# Patient Record
Sex: Male | Born: 1940 | Race: White | Hispanic: No | Marital: Married | Smoking: Never smoker
Health system: Southern US, Community
[De-identification: ages and names within clinical notes are randomized; demographics above are authoritative.]

## PROBLEM LIST (undated history)

## (undated) DIAGNOSIS — C61 Malignant neoplasm of prostate: Secondary | ICD-10-CM

## (undated) DIAGNOSIS — I872 Venous insufficiency (chronic) (peripheral): Secondary | ICD-10-CM

## (undated) DIAGNOSIS — G20A1 Parkinson's disease without dyskinesia, without mention of fluctuations: Secondary | ICD-10-CM

## (undated) DIAGNOSIS — G20 Parkinson's disease: Secondary | ICD-10-CM

## (undated) HISTORY — PX: TONSILLECTOMY: SUR1361

## (undated) HISTORY — PX: HERNIA REPAIR: SHX51

## (undated) HISTORY — PX: PROSTATE SURGERY: SHX751

---

## 2011-11-09 DIAGNOSIS — G20A1 Parkinson's disease without dyskinesia, without mention of fluctuations: Secondary | ICD-10-CM | POA: Insufficient documentation

## 2015-01-27 DIAGNOSIS — I2510 Atherosclerotic heart disease of native coronary artery without angina pectoris: Secondary | ICD-10-CM | POA: Insufficient documentation

## 2015-10-14 DIAGNOSIS — M40204 Unspecified kyphosis, thoracic region: Secondary | ICD-10-CM | POA: Insufficient documentation

## 2017-04-11 DIAGNOSIS — M5090 Cervical disc disorder, unspecified, unspecified cervical region: Secondary | ICD-10-CM | POA: Insufficient documentation

## 2017-04-11 DIAGNOSIS — E785 Hyperlipidemia, unspecified: Secondary | ICD-10-CM | POA: Insufficient documentation

## 2017-04-11 DIAGNOSIS — I471 Supraventricular tachycardia, unspecified: Secondary | ICD-10-CM | POA: Insufficient documentation

## 2017-06-23 DIAGNOSIS — H31002 Unspecified chorioretinal scars, left eye: Secondary | ICD-10-CM | POA: Insufficient documentation

## 2017-06-23 DIAGNOSIS — H524 Presbyopia: Secondary | ICD-10-CM | POA: Insufficient documentation

## 2017-06-23 DIAGNOSIS — H02831 Dermatochalasis of right upper eyelid: Secondary | ICD-10-CM | POA: Insufficient documentation

## 2017-06-23 DIAGNOSIS — H43813 Vitreous degeneration, bilateral: Secondary | ICD-10-CM | POA: Insufficient documentation

## 2017-06-23 DIAGNOSIS — H25042 Posterior subcapsular polar age-related cataract, left eye: Secondary | ICD-10-CM | POA: Insufficient documentation

## 2017-06-23 DIAGNOSIS — H2513 Age-related nuclear cataract, bilateral: Secondary | ICD-10-CM | POA: Insufficient documentation

## 2017-06-23 DIAGNOSIS — H35411 Lattice degeneration of retina, right eye: Secondary | ICD-10-CM | POA: Insufficient documentation

## 2017-10-26 DIAGNOSIS — E7800 Pure hypercholesterolemia, unspecified: Secondary | ICD-10-CM | POA: Insufficient documentation

## 2018-10-03 DIAGNOSIS — Z8719 Personal history of other diseases of the digestive system: Secondary | ICD-10-CM | POA: Insufficient documentation

## 2018-10-22 DIAGNOSIS — R1909 Other intra-abdominal and pelvic swelling, mass and lump: Secondary | ICD-10-CM | POA: Insufficient documentation

## 2018-11-05 DIAGNOSIS — M79672 Pain in left foot: Secondary | ICD-10-CM | POA: Insufficient documentation

## 2019-01-23 DIAGNOSIS — M545 Low back pain, unspecified: Secondary | ICD-10-CM | POA: Insufficient documentation

## 2019-09-10 ENCOUNTER — Other Ambulatory Visit: Payer: Self-pay

## 2019-09-10 ENCOUNTER — Ambulatory Visit: Payer: Medicare PPO | Admitting: Podiatry

## 2019-09-10 DIAGNOSIS — L600 Ingrowing nail: Secondary | ICD-10-CM | POA: Diagnosis not present

## 2019-09-10 DIAGNOSIS — M79676 Pain in unspecified toe(s): Secondary | ICD-10-CM

## 2019-09-10 DIAGNOSIS — I739 Peripheral vascular disease, unspecified: Secondary | ICD-10-CM

## 2019-09-10 NOTE — Progress Notes (Signed)
  Subjective:  Patient ID: Joseph Nelson, male    DOB: September 12, 1940,  MRN: [id number]  Chief Complaint  Patient presents with  . Nail Problem    Lt hallux medial border x 2 wks; 8/10 occasional pain -wrose with shoes -pt dneis swelling/ drianage -w/ redness  -feet very red in AM txc: none    79 y.o. male presents with the above complaint. History confirmed with patient.   Objective:  Physical Exam: warm, good capillary refill, no trophic changes or ulcerative lesions, normal DP and PT pulses delayed capillary refill bilat, and normal sensory exam.  Painful ingrowing nail at  medial border of the left, hallux; without warmth, erythema or drainage  Assessment:   1. Ingrown nail   2. Pain around toenail   3. PAD (peripheral artery disease) ([place])      Plan:  Patient was evaluated and treated and all questions answered.  Ingrown Nail, left -Nail gently debrided in slant back fashion  PAD -Educated on microcirculatory PAD. He has good pedal pulses. Discussed that there are few treatments for microcirculatory disease but we could do circulatory tests if these worsen.  Return if symptoms worsen or fail to improve.   MDM

## 2019-10-28 ENCOUNTER — Ambulatory Visit: Payer: Medicare PPO | Admitting: Podiatry

## 2019-10-29 ENCOUNTER — Ambulatory Visit (INDEPENDENT_AMBULATORY_CARE_PROVIDER_SITE_OTHER): Payer: Medicare PPO

## 2019-10-29 ENCOUNTER — Telehealth: Payer: Self-pay | Admitting: *Deleted

## 2019-10-29 ENCOUNTER — Ambulatory Visit: Payer: Medicare PPO | Admitting: Podiatry

## 2019-10-29 ENCOUNTER — Other Ambulatory Visit: Payer: Self-pay

## 2019-10-29 DIAGNOSIS — I739 Peripheral vascular disease, unspecified: Secondary | ICD-10-CM | POA: Diagnosis not present

## 2019-10-29 DIAGNOSIS — L600 Ingrowing nail: Secondary | ICD-10-CM | POA: Diagnosis not present

## 2019-10-29 DIAGNOSIS — M79676 Pain in unspecified toe(s): Secondary | ICD-10-CM

## 2019-10-29 NOTE — Telephone Encounter (Signed)
[place] scheduled [id number] for 11/06/2019 arrive 1:30pm for 2:00pm testing. Faxed orders to [place].

## 2019-10-29 NOTE — Telephone Encounter (Signed)
-----   Message from Evelina Bucy, DPM sent at 10/29/2019 10:03 AM EDT ----- Can we order non-invasive arterials at [facility]?

## 2019-10-29 NOTE — Telephone Encounter (Signed)
Message left informing pt of [facility] appt 11/06/2019.

## 2019-10-29 NOTE — Progress Notes (Signed)
  Subjective:  Patient ID: Joseph Nelson, male    DOB: 1941-03-24,  MRN: [id number]  Chief Complaint  Patient presents with  . Foot Problem    redness, swelling and pian of BL toes (1-10) (Lt>Rt) x 1 year; 8/10 occasional pain -pt staqtes it only happens when resting or at night time - pt staqtes,": it goes away when I'm walking." -w/ stiffness and numbness at Lt hallux Tx: compression sock     79 y.o. male presents with the above complaint. History confirmed with patient.   Objective:  Physical Exam: warm, good capillary refill, no trophic changes or ulcerative lesions, normal DP and PT pulses delayed capillary refill bilat, and normal sensory exam.  Painful ingrowing nail at  medial border of the left, hallux; without warmth, erythema or drainage  Assessment:   1. PAD (peripheral artery disease) ([place])   2. Ingrown nail   3. Pain around toenail    Plan:  Patient was evaluated and treated and all questions answered.  Ingrown Nail, left -Again debrided in slant back fashion to patient relief  PAD -Possible microcirc given good pedal pulses. Order non-invasive vascular studies for further eval  Return in about 1 month (around 11/28/2019) for Review vascular studies.

## 2019-11-12 ENCOUNTER — Encounter: Payer: Self-pay | Admitting: Podiatry

## 2019-11-25 ENCOUNTER — Other Ambulatory Visit: Payer: Self-pay

## 2019-11-25 ENCOUNTER — Ambulatory Visit: Payer: Medicare PPO | Admitting: Podiatry

## 2019-11-25 DIAGNOSIS — L600 Ingrowing nail: Secondary | ICD-10-CM

## 2019-11-25 DIAGNOSIS — I739 Peripheral vascular disease, unspecified: Secondary | ICD-10-CM

## 2019-11-25 NOTE — Progress Notes (Signed)
°  Subjective:  Patient ID: Joseph Nelson, male    DOB: 03/02/1941,  MRN: [id number]  Chief Complaint  Patient presents with   Foot Pain    F/U PAD and BL foot pain Pt. states," it's the same, worse on the Rt than the LT; 3/10 pain." -worse at night -no swelling Tx: none    Results    Review vascular results   79 y.o. male presents with the above complaint. History confirmed with patient.   Objective:  Physical Exam: warm, good capillary refill, no trophic changes or ulcerative lesions, normal DP and PT pulses delayed capillary refill bilat, and normal sensory exam.  Painful ingrowing nail at  medial border of the left, hallux; without warmth, erythema or drainage  Assessment:   1. PAD (peripheral artery disease) ([place])   2. Ingrown nail    Plan:  Patient was evaluated and treated and all questions answered.  Ingrown Nail, left -Nail debrided again in slant back fashion  PAD -Reviewed vascular studies discussed microcirculatory etiology. Discussed lifestyle modifications. Offered referral to vascular surgery patient declined.  No follow-ups on file.

## 2019-12-03 ENCOUNTER — Ambulatory Visit: Payer: Medicare PPO | Admitting: Podiatry

## 2020-02-13 DIAGNOSIS — I739 Peripheral vascular disease, unspecified: Secondary | ICD-10-CM | POA: Insufficient documentation

## 2020-05-21 ENCOUNTER — Ambulatory Visit: Payer: Medicare PPO | Admitting: Podiatry

## 2020-05-25 ENCOUNTER — Ambulatory Visit (INDEPENDENT_AMBULATORY_CARE_PROVIDER_SITE_OTHER): Payer: Medicare PPO | Admitting: Podiatry

## 2020-05-25 DIAGNOSIS — Z5329 Procedure and treatment not carried out because of patient's decision for other reasons: Secondary | ICD-10-CM

## 2020-05-25 NOTE — Progress Notes (Signed)
No show for appt. 

## 2020-05-28 ENCOUNTER — Ambulatory Visit: Payer: Medicare PPO | Admitting: Podiatry

## 2020-05-28 ENCOUNTER — Other Ambulatory Visit: Payer: Self-pay

## 2020-05-28 ENCOUNTER — Encounter: Payer: Self-pay | Admitting: Podiatry

## 2020-05-28 DIAGNOSIS — L600 Ingrowing nail: Secondary | ICD-10-CM

## 2020-05-28 DIAGNOSIS — M79676 Pain in unspecified toe(s): Secondary | ICD-10-CM

## 2020-05-28 DIAGNOSIS — I739 Peripheral vascular disease, unspecified: Secondary | ICD-10-CM

## 2020-05-28 NOTE — Progress Notes (Signed)
  Subjective:  Patient ID: Joseph Nelson, male    DOB: Sep 02, 1940,  MRN: [id number]  Chief Complaint  Patient presents with  . Nail Problem    I have some soreness in the left bit toe and would like to get the toenails trimmed   80 y.o. male presents with the above complaint. History confirmed with patient.   Objective:  Physical Exam: warm, good capillary refill, no trophic changes or ulcerative lesions, normal DP and PT pulses delayed capillary refill bilat, and normal sensory exam.  Painful ingrowing nail at  medial border of the left, hallux; without warmth, erythema or drainage. Nails elongated and thickened.  Assessment:   1. Ingrown nail   2. PAD (peripheral artery disease) ([facility])   3. Pain around toenail    Plan:  Patient was evaluated and treated and all questions answered.  Ingrown Nail, left -Again debrided in slant back fashion. Other nails trimmed, courtesy.  PAD -Again discussed microcirculatory disease and explained at length. Advise he continue walking to promote demand for collateral flow. In asbsence of major blockage unlikely that vascular intervention can improve microcirculatory insufficiency.  No follow-ups on file.

## 2020-09-11 ENCOUNTER — Emergency Department (HOSPITAL_BASED_OUTPATIENT_CLINIC_OR_DEPARTMENT_OTHER): Payer: Medicare PPO

## 2020-09-11 ENCOUNTER — Other Ambulatory Visit: Payer: Self-pay

## 2020-09-11 ENCOUNTER — Emergency Department (HOSPITAL_BASED_OUTPATIENT_CLINIC_OR_DEPARTMENT_OTHER)
Admission: EM | Admit: 2020-09-11 | Discharge: 2020-09-11 | Disposition: A | Discharge status: Home / Self Care | Payer: Medicare PPO | Attending: Emergency Medicine | Admitting: Emergency Medicine

## 2020-09-11 DIAGNOSIS — G20 Parkinson's disease: Secondary | ICD-10-CM | POA: Diagnosis not present

## 2020-09-11 DIAGNOSIS — M79604 Pain in right leg: Secondary | ICD-10-CM | POA: Diagnosis not present

## 2020-09-11 DIAGNOSIS — M79605 Pain in left leg: Secondary | ICD-10-CM | POA: Diagnosis not present

## 2020-09-11 DIAGNOSIS — Z79899 Other long term (current) drug therapy: Secondary | ICD-10-CM | POA: Diagnosis not present

## 2020-09-11 DIAGNOSIS — I2510 Atherosclerotic heart disease of native coronary artery without angina pectoris: Secondary | ICD-10-CM | POA: Diagnosis not present

## 2020-09-11 DIAGNOSIS — M79606 Pain in leg, unspecified: Secondary | ICD-10-CM

## 2020-09-11 NOTE — ED Notes (Signed)
Pt has call bell, lights turned down and ED process explained to pt and spouse

## 2020-09-11 NOTE — Discharge Instructions (Signed)
See your Physician for recheck.  °

## 2020-09-11 NOTE — ED Provider Notes (Signed)
[facility] EMERGENCY DEPARTMENT Provider Note   CSN: [id number] Arrival date & time: 09/11/20  1303     History Chief Complaint  Patient presents with  . Leg Pain    left    Joseph Nelson is a 80 y.o. male.  Pt reports he had a cramp in the back of his left leg today.  Pt reports he had a similiar episode last month.  Pt is concerned that he could have a blood clot in his legs.  Pt reports his feet look discolored to him,  Pt reports he had a blood clot years ago.  Pt is not on any blood thinners   The history is provided by the patient. No language interpreter was used.  Leg Pain Location:  Leg Leg location:  L leg and R leg Pain details:    Quality:  Aching   Radiates to:  Does not radiate   Severity:  No pain   Timing:  Constant Chronicity:  New Foreign body present:  No foreign bodies Relieved by:  Nothing Worsened by:  Nothing      No past medical history on file.  Patient Active Problem List   Diagnosis Date Noted  . PVD (peripheral vascular disease) ([place]) 02/13/2020  . Low back pain 01/23/2019  . Pain in both feet 11/05/2018  . Groin swelling 10/22/2018  . S/P inguinal hernia repair 10/03/2018  . Pure hypercholesterolemia 10/26/2017  . 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 Dermatochalasis of both upper eyelids 06/23/2017  . Lattice degeneration, right 06/23/2017  . Nuclear sclerotic cataract of both eyes 06/23/2017  . Posterior subcapsular age-related cataract, left eye 06/23/2017  . Posterior vitreous detachment of both eyes 06/23/2017  . Presbyopia of both eyes 06/23/2017  . Cervical disc disorder 04/11/2017  . HLD (hyperlipidemia) 04/11/2017  . Supraventricular tachycardia ([place]) 04/11/2017  . Kyphosis of thoracic region 10/14/2015  . Atherosclerosis of native coronary artery of native heart without angina pectoris 01/27/2015  . Cervical post-laminectomy syndrome 04/29/2013  . Routine history and physical examination of adult  06/07/2012  . Parkinson's disease ([place]) 11/09/2011  . Recurrent depressive disorder, current episode mild ([place]) 04/06/2011  . Anxiety state 02/02/2011    No past surgical history on file.     No family history on file.     Home Medications Prior to Admission medications   Medication Sig Start Date End Date Taking? Authorizing Provider  ALPRAZolam Duanne Moron) 0.25 MG tablet Take 1 tablet by mouth daily as needed. 10/17/18   [provider]  carbidopa-levodopa-entacapone (STALEVO) 25-100-200 MG tablet TAKE 1 TABLET BY MOUTH 5 TIMES A DAY 07/08/14   [provider]  celecoxib (CELEBREX) 100 MG capsule  09/07/19   [provider]  Cholecalciferol 125 MCG (5000 UT) capsule TAKE 5000 UNITS BY MOUTH DAILY. 09/30/19   [provider]  DULoxetine HCl 40 MG CPEP  10/16/19   [provider]  ezetimibe (ZETIA) 10 MG tablet Take by mouth. 10/11/19 10/10/20  [provider]  gabapentin (NEURONTIN) 100 MG capsule  10/03/19   [provider]  nitroGLYCERIN (NITROSTAT) 0.4 MG SL tablet PLACE 1 TABLET UNDER THE TONGUE EVERY 5 MINUTES AS NEEDED FOR CHEST PAIN. 09/23/19   [provider]    Allergies    Clonazepam, Penicillins, Rabeprazole, Ciprofloxacin, Clarithromycin, Codeine, Cyclobenzaprine, Diphenhydramine hcl, Ciprofloxacin hcl, Citalopram, Erythromycin, Meperidine, Minocycline, Nabumetone, Other, Prednisone, Propoxyphene, Sulfa antibiotics, and Tramadol  Review of Systems   Review of Systems  All other systems  reviewed and are negative.   Physical Exam Updated Vital Signs BP 113/65   Pulse 65   Temp 97.9 F (36.6 C) (Oral)   Resp 18   Ht 6' (1.829 m)   Wt 83 kg   SpO2 97%   BMI 24.82 kg/m   Physical Exam Vitals and nursing note reviewed.  Constitutional:      Appearance: He is well-developed.  HENT:     Head: Normocephalic and atraumatic.  Eyes:     Conjunctiva/sclera: Conjunctivae normal.  Cardiovascular:     Rate  and Rhythm: Normal rate and regular rhythm.     Heart sounds: No murmur heard.   Pulmonary:     Effort: Pulmonary effort is normal. No respiratory distress.     Breath sounds: Normal breath sounds.  Abdominal:     Palpations: Abdomen is soft.     Tenderness: There is no abdominal tenderness.  Musculoskeletal:        General: No swelling or tenderness. Normal range of motion.     Cervical back: Neck supple.     Comments: Good pulses, normal cap refill   Skin:    General: Skin is warm and dry.  Neurological:     General: No focal deficit present.     Mental Status: He is alert.  Psychiatric:        Mood and Affect: Mood normal.     ED Results / Procedures / Treatments   Labs (all labs ordered are listed, but only abnormal results are displayed) Labs Reviewed - No data to display  EKG EKG Interpretation  Date/Time:  Friday September 11 2020 13:24:02 EDT Ventricular Rate:  72 PR Interval:  156 QRS Duration: 99 QT Interval:  393 QTC Calculation: 431 R Axis:   -5 Text Interpretation: Sinus rhythm Confirmed by Lennice Sites ([id number]) on 09/11/2020 1:29:02 PM   Radiology US Venous Img Lower Bilateral (DVT)  Result Date: 09/11/2020 CLINICAL DATA:  Lower extremity pain bilaterally EXAM: BILATERAL LOWER EXTREMITY VENOUS DUPLEX ULTRASOUND TECHNIQUE: Gray-scale sonography with graded compression, as well as color Doppler and duplex ultrasound were performed to evaluate the lower extremity deep venous systems from the level of the common femoral vein and including the common femoral, femoral, profunda femoral, popliteal and calf veins including the posterior tibial, peroneal and gastrocnemius veins when visible. The superficial great saphenous vein was also interrogated. Spectral Doppler was utilized to evaluate flow at rest and with distal augmentation maneuvers in the common femoral, femoral and popliteal veins. COMPARISON:  None. FINDINGS: RIGHT LOWER EXTREMITY Common Femoral Vein: No  evidence of thrombus. Normal compressibility, respiratory phasicity and response to augmentation. Saphenofemoral Junction: No evidence of thrombus. Normal compressibility and flow on color Doppler imaging. Profunda Femoral Vein: No evidence of thrombus. Normal compressibility and flow on color Doppler imaging. Femoral Vein: No evidence of thrombus. Normal compressibility, respiratory phasicity and response to augmentation. Popliteal Vein: No evidence of thrombus. Normal compressibility, respiratory phasicity and response to augmentation. Calf Veins: No evidence of thrombus. Normal compressibility and flow on color Doppler imaging. Superficial Great Saphenous Vein: No evidence of thrombus. Normal compressibility. Venous Reflux:  None. Other Findings:  None. LEFT LOWER EXTREMITY Common Femoral Vein: No evidence of thrombus. Normal compressibility, respiratory phasicity and response to augmentation. Saphenofemoral Junction: No evidence of thrombus. Normal compressibility and flow on color Doppler imaging. Profunda Femoral Vein: No evidence of thrombus. Normal compressibility and flow on color Doppler imaging. Femoral Vein: No evidence of thrombus. Normal compressibility, respiratory phasicity and response  to augmentation. Popliteal Vein: No evidence of thrombus. Normal compressibility, respiratory phasicity and response to augmentation. Calf Veins: No evidence of thrombus. Normal compressibility and flow on color Doppler imaging. Superficial Great Saphenous Vein: No evidence of thrombus. Normal compressibility. Venous Reflux:  None. Other Findings:  None. IMPRESSION: No evidence of deep venous thrombosis in either lower extremity. Electronically Signed   By: Lowella Grip III M.D.   On: 09/11/2020 15:08    Procedures Procedures   Medications Ordered in ED Medications - No data to display  ED Course  I have reviewed the triage vital signs and the nursing notes.  Pertinent labs & imaging results that were  available during my care of the patient were reviewed by me and considered in my medical decision making (see chart for details).    MDM Rules/Calculators/A&P                          MDM:  Doppler ultrasound is normal  Pt counseled on reulsts  Final Clinical Impression(s) / ED Diagnoses Final diagnoses:  Right leg pain  Left leg pain    Rx / DC Orders ED Discharge Orders    None    An After Visit Summary was printed and given to the patient.    Fransico Meadow, PA-C 09/11/20 [place], [place], DO 09/11/20 1703

## 2020-12-30 ENCOUNTER — Other Ambulatory Visit: Payer: Self-pay

## 2020-12-30 ENCOUNTER — Emergency Department (HOSPITAL_BASED_OUTPATIENT_CLINIC_OR_DEPARTMENT_OTHER): Payer: Medicare PPO

## 2020-12-30 ENCOUNTER — Emergency Department (HOSPITAL_BASED_OUTPATIENT_CLINIC_OR_DEPARTMENT_OTHER)
Admission: EM | Admit: 2020-12-30 | Discharge: 2020-12-30 | Disposition: A | Discharge status: Home / Self Care | Payer: Medicare PPO | Attending: Emergency Medicine | Admitting: Emergency Medicine

## 2020-12-30 ENCOUNTER — Encounter (HOSPITAL_BASED_OUTPATIENT_CLINIC_OR_DEPARTMENT_OTHER): Payer: Self-pay

## 2020-12-30 DIAGNOSIS — Z79899 Other long term (current) drug therapy: Secondary | ICD-10-CM | POA: Diagnosis not present

## 2020-12-30 DIAGNOSIS — Z8546 Personal history of malignant neoplasm of prostate: Secondary | ICD-10-CM | POA: Diagnosis not present

## 2020-12-30 DIAGNOSIS — M549 Dorsalgia, unspecified: Secondary | ICD-10-CM | POA: Insufficient documentation

## 2020-12-30 DIAGNOSIS — R109 Unspecified abdominal pain: Secondary | ICD-10-CM | POA: Diagnosis not present

## 2020-12-30 DIAGNOSIS — G20 Parkinson's disease: Secondary | ICD-10-CM | POA: Diagnosis not present

## 2020-12-30 HISTORY — DX: Parkinson's disease without dyskinesia, without mention of fluctuations: G20.A1

## 2020-12-30 HISTORY — DX: Parkinson's disease: G20

## 2020-12-30 HISTORY — DX: Malignant neoplasm of prostate: C61

## 2020-12-30 HISTORY — DX: Venous insufficiency (chronic) (peripheral): I87.2

## 2020-12-30 LAB — BASIC METABOLIC PANEL
Anion gap: 8 (ref 5–15)
BUN: 18 mg/dL (ref 8–23)
CO2: 26 mmol/L (ref 22–32)
Calcium: 8.9 mg/dL (ref 8.9–10.3)
Chloride: 102 mmol/L (ref 98–111)
Creatinine, Ser: 0.93 mg/dL (ref 0.61–1.24)
GFR, Estimated: >60 mL/min (ref >60)
Glucose, Bld: 115 mg/dL — ABNORMAL HIGH (ref 70–99)
Potassium: 4 mmol/L (ref 3.5–5.1)
Sodium: 136 mmol/L (ref 135–145)

## 2020-12-30 LAB — CBC
HCT: 46.7 % (ref 39.0–52.0)
Hemoglobin: 15.7 g/dL (ref 13.0–17.0)
MCH: 30.9 pg (ref 26.0–34.0)
MCHC: 33.6 g/dL (ref 30.0–36.0)
MCV: 91.9 fL (ref 80.0–100.0)
Platelets: 227 10*3/uL (ref 150–400)
RBC: 5.08 MIL/uL (ref 4.22–5.81)
RDW: 12.9 % (ref 11.5–15.5)
WBC: 7.8 10*3/uL (ref 4.0–10.5)
nRBC: 0 % (ref 0.0–0.2)

## 2020-12-30 LAB — URINALYSIS, ROUTINE W REFLEX MICROSCOPIC
Bilirubin Urine: NEGATIVE
Glucose, UA: 100 mg/dL — AB
Hgb urine dipstick: NEGATIVE
Ketones, ur: 15 mg/dL — AB
Leukocytes,Ua: NEGATIVE
Nitrite: NEGATIVE
Protein, ur: NEGATIVE mg/dL
Specific Gravity, Urine: 1.025 (ref 1.005–1.030)
pH: 5 (ref 5.0–8.0)

## 2020-12-30 MED ORDER — METHOCARBAMOL 500 MG PO TABS: 500.0000 mg | ORAL_TABLET | Freq: Three times a day (TID) | ORAL | 0 refills | Status: AC | PRN

## 2020-12-30 MED ORDER — IOHEXOL 300 MG/ML  SOLN
100.0000 mL | Freq: Once | INTRAMUSCULAR | Status: AC | PRN
  Administered 2020-12-30: 100 mL via INTRAVENOUS

## 2020-12-30 NOTE — Discharge Instructions (Addendum)
Dear Joseph Nelson,  Today we evaluated you in the emergency department for left lower back pain.   We ruled out a urinary track infection  with a urinalysis which was negative.   We also checked a basic metabolic panel which showed no renal cause for your back pain. Due to your history of prostate disease and your tenderness to palpation over the left iliac crest, we ordered a CT of your abdomen and pelvis as well as your lumbar spine. That imaging showed no concerning acute changes. We did notice the fatty lesion in your bone at that point. We feel that your back pain may be due to some irritation resulting from this lesion since the pain you are reporting is located directly at this point. For this we will give you a low dose of Robaxin to help with the muscle spasm/pain.  Please follow up with your primary care physician in 1 week.

## 2020-12-30 NOTE — ED Notes (Signed)
Asked to evaluate PT in Waiting Room- per registration PT not feeling well. Vitals are on Flowsheet. PT does not appear to be in distress at this time and is A/O x 4. PT states that he has Parkinson's medications that are due. After speaking to Charge RN, PT was allowed to take due meds (provided PT with water). PT was able to successfully take meds and PT stated he had a light lunch and felt a little light headed (PT received 1 pack crackers).

## 2020-12-30 NOTE — ED Triage Notes (Addendum)
Pt c/o left flank pain x 3 days-when asked if urinary sx states "I did have a harder time getting started today"-NAD-steady gait with own cane

## 2020-12-30 NOTE — ED Provider Notes (Signed)
[facility] EMERGENCY DEPARTMENT Provider Note   CSN: [id number] Arrival date & time: 12/30/20  1327     History Chief Complaint  Patient presents with   Flank Pain    Joseph Nelson is a 80 y.o. male. presents with 2-3 months of dull aching pain that has worsened to moderate-severe burning pain over the past three days.  He reports that the pain worsens with physical activity/movement and alleviated with rest. Does occasionally feel pain while resting and reports it is "opportunistic". He has tried tylenol which has also helped some but does not completely relieve it. He has ben evaluated previously for same pain and was prescribed lidocaine patches which did not improve pain at all.  The history is provided by the patient. No language interpreter was used.  Flank Pain This is a new problem. The current episode started more than 1 week ago. The problem occurs hourly. The problem has been rapidly worsening. Pertinent negatives include no chest pain, no abdominal pain, no headaches and no shortness of breath. The symptoms are aggravated by walking, bending, twisting and standing. The symptoms are relieved by NSAIDs and rest. He has tried acetaminophen and rest for the symptoms. The treatment provided moderate relief.      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   Nuclear sclerotic cataract of both eyes 06/23/2017   Posterior subcapsular age-related cataract, left eye 06/23/2017   Posterior  vitreous detachment of both eyes 06/23/2017   Presbyopia of both eyes 06/23/2017   Cervical disc disorder 04/11/2017   HLD (hyperlipidemia) 04/11/2017   Supraventricular tachycardia ([place]) 04/11/2017   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  Social History   Tobacco Use   Smoking status: Never   Smokeless tobacco: Never  Substance Use Topics   Alcohol use: Never   Drug use: Never    Home Medications Prior to Admission medications   Medication Sig Start Date End Date Taking? Authorizing Provider  methocarbamol (ROBAXIN) 500 MG tablet Take 1 tablet (500 mg total) by mouth 3 (three) times daily as needed for up to 7 days for muscle spasms. 12/30/20 01/06/21 Yes Delene Ruffini, MD  ALPRAZolam Duanne Moron) 0.25 MG tablet Take 1 tablet by mouth daily as needed. 10/17/18   [provider]  carbidopa-levodopa-entacapone (STALEVO) 25-100-200 MG tablet TAKE 1 TABLET BY MOUTH 5 TIMES A DAY 07/08/14   [provider]  celecoxib (CELEBREX) 100 MG capsule  09/07/19   [provider]  Cholecalciferol  125 MCG (5000 UT) capsule TAKE 5000 UNITS BY MOUTH DAILY. 09/30/19   [provider]  DULoxetine HCl 40 MG CPEP  10/16/19   [provider]  ezetimibe (ZETIA) 10 MG tablet Take by mouth. 10/11/19 10/10/20  [provider]  gabapentin (NEURONTIN) 100 MG capsule  10/03/19   [provider]  nitroGLYCERIN (NITROSTAT) 0.4 MG SL tablet PLACE 1 TABLET UNDER THE TONGUE  EVERY 5 MINUTES AS NEEDED FOR CHEST PAIN. 09/23/19   [provider]    Allergies    Clonazepam, Penicillins, Rabeprazole, Ciprofloxacin, Clarithromycin, Codeine, Cyclobenzaprine, Diphenhydramine hcl, Ciprofloxacin hcl, Citalopram, Erythromycin, Meperidine, Minocycline, Nabumetone, Other, Prednisone, Propoxyphene, Sulfa antibiotics, and Tramadol  Review of Systems   Review of Systems  Constitutional:  Negative for fatigue and fever.  Respiratory:  Negative for cough, chest tightness and shortness of breath.   Cardiovascular:  Negative for chest pain, palpitations and leg swelling.  Gastrointestinal:  Positive for constipation. Negative for abdominal pain, blood in stool, diarrhea, nausea and vomiting.  Genitourinary:  Positive for flank pain.  Musculoskeletal:  Positive for back pain.  Skin:  Negative for rash and wound.  Neurological:  Negative for numbness and headaches.  Psychiatric/Behavioral: Negative.     Physical Exam Updated Vital Signs BP 118/73 (BP Location: Left Arm)   Pulse 60   Temp 98.4 F (36.9 C) (Oral)   Resp 18   Ht 6' (1.829 m)   Wt 84.4 kg   SpO2 100%   BMI 25.23 kg/m   Physical Exam Constitutional:      Appearance: Normal appearance. He is normal weight.  HENT:     Head: Normocephalic and atraumatic.  Cardiovascular:     Rate and Rhythm: Normal rate and regular rhythm.  Pulmonary:     Effort: Pulmonary effort is normal.     Breath sounds: Normal breath sounds.  Abdominal:     General: Abdomen is flat. Bowel sounds are normal.     Palpations: Abdomen is soft.  Musculoskeletal:        General: Tenderness present.  Skin:    General: Skin is warm and dry.  Neurological:     General: No focal deficit present.     Mental Status: He is alert.  Psychiatric:        Mood and Affect: Mood normal.        Behavior: Behavior normal.        Thought Content: Thought content normal.        Judgment: Judgment normal.    ED Results / Procedures /  Treatments   Labs (all labs ordered are listed, but only abnormal results are displayed) Labs Reviewed  URINALYSIS, ROUTINE W REFLEX MICROSCOPIC - Abnormal; Notable for the following components:      Result Value   Color, Urine ORANGE (*)    APPearance HAZY (*)    Glucose, UA 100 (*)    Ketones, ur 15 (*)    All other components within normal limits  BASIC METABOLIC PANEL - Abnormal; Notable for the following components:   Glucose, Bld 115 (*)    All other components within normal limits  CBC    EKG None  Radiology CT ABDOMEN PELVIS W CONTRAST  Result Date: 12/30/2020 CLINICAL DATA:  Pt c/o left flank pain x 3 days-when asked if urinary sx states EXAM: CT ABDOMEN AND PELVIS WITH CONTRAST TECHNIQUE: Multidetector CT imaging of the abdomen and pelvis was performed using the standard protocol following bolus administration of intravenous contrast. CONTRAST:  146m OMNIPAQUE IOHEXOL 300 MG/ML  SOLN COMPARISON:  None. FINDINGS: Lower chest: 5 mm left lower lobe pulmonary nodule (4:22). 3 mm right lower lobe pulmonary nodule (4:2). Coronary calcification. Hepatobiliary: No focal liver abnormality. No gallstones, gallbladder wall thickening, or pericholecystic fluid. No biliary dilatation. Pancreas: No focal lesion. Normal pancreatic contour. No surrounding inflammatory changes. No main pancreatic ductal dilatation. Spleen: Normal in size without focal abnormality. Adrenals/Urinary Tract: Left adrenal gland calcifications.  No adrenal nodule bilaterally. Bilateral kidneys enhance symmetrically. There is a 5.8 cm fluid density lesion within the left kidney that likely represents a simple renal cyst. No hydronephrosis. No hydroureter. The urinary bladder is unremarkable. On delayed imaging, there is no urothelial wall thickening and there are no filling defects in the opacified portions of the bilateral collecting systems or ureters. Stomach/Bowel: Stomach is within normal limits. No evidence of bowel  wall thickening or dilatation. Diffuse sigmoid diverticulosis. Appendix appears normal. Vascular/Lymphatic: No abdominal aorta or iliac aneurysm. Severe atherosclerotic plaque of the aorta and its branches. No abdominal, pelvic, or inguinal lymphadenopathy. Reproductive: Status post prostatectomy. Other: No intraperitoneal free fluid. No intraperitoneal free gas. No organized fluid collection. Nonspecific adjacent 0.6 cm and 0.2 cm soft tissue densities along the posterior right hepatic lobe (2:66, 5:23). Musculoskeletal: No abdominal wall hernia or abnormality. There is a lytic lesion of the iliac bone with associated central calcification (2:45). Lesion appears to have a narrow zone of transition and is well-defined. Otherwise no suspicious lytic or blastic osseous lesions. No acute displaced fracture. Levoscoliosis of the lumbar spine with compensatory dextroscoliosis of the thoracolumbar spine. Multilevel severe degenerative changes of the spine. Please see separately dictated CT lumbar spine 12/30/2020. IMPRESSION: 1. Diffuse sigmoid diverticulosis with no acute diverticulitis. 2. 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 3. Nonspecific 0.6 cm and 0.2 cm soft tissue densities along the posterior right hepatic lobe. Comparison with prior cross-sectional imaging may be of value. 4. A 5 mm left lower and a 3 mm right lower pulmonary nodule. No follow-up needed if patient is low-risk (and has no known or suspected primary neoplasm). Non-contrast chest CT can be considered in 12 months if patient is high-risk. This recommendation follows the consensus statement: Guidelines for Management of Incidental Pulmonary Nodules Detected on CT Images: From the Fleischner Society 2017; Radiology 2017; 284:228-243. 5. Aortic Atherosclerosis (ICD10-I70.0). 6. Please see separately dictated CT lumbar spine 12/30/2020. These results were called by telephone  at the time of interpretation on 12/30/2020 at 5:46 pm [facility] who verbally acknowledged these results. Electronically Signed   By: MIven FinnM.D.   On: 12/30/2020 17:57   CT L-SPINE NO CHARGE  Result Date: 12/30/2020 CLINICAL DATA:  Left flank pain over the last 3 days. EXAM: CT LUMBAR SPINE WITHOUT CONTRAST TECHNIQUE: Multidetector CT imaging of the lumbar spine was performed without intravenous contrast administration. Multiplanar CT image reconstructions were also generated. COMPARISON:  None. FINDINGS: Segmentation: 5 lumbar type vertebral bodies. Alignment: Thoracolumbar curvature convex to the right and lower lumbar curvature convex to the left. Vertebrae: No fracture or focal lesion. Discogenic sclerotic change of the endplates on the left at T12-L1 and on the right at L4-5. Paraspinal and other soft tissues: See results of abdominal CT. Disc levels: T9-10, T10-11 and T11-12: No significant finding. T12-L1: Disc degeneration more pronounced on the left. Endplate osteophytes and bulging of the disc. Mild facet hypertrophy. Mild left lateral recess stenosis.  L1-2: Disc degeneration more pronounced on the left with vacuum phenomenon. Mild facet hypertrophy. Mild narrowing of the left lateral recess and foramen on the left. L2-3: Disc degeneration with vacuum phenomenon. Bilateral facet degeneration. Narrowing of both lateral recesses and neural foramina. L3-4: Endplate osteophytes and mild bulging of the disc. Bilateral facet degeneration. Mild right lateral recess and foraminal stenosis. L4-5: Disc degeneration more pronounced on the right. Endplate osteophytes and bulging of the disc. Facet and ligamentous hypertrophy right more than left. Multifactorial stenosis at this level that could cause neural compression on either side, more likely the right. Severe right foraminal stenosis. Foramen on the left widely patent. L5-S1: Endplate osteophytes and bulging of the disc. Facet  and ligamentous hypertrophy. Narrowing of the subarticular lateral recesses and neural foramina that could cause neural compression, particularly on the right. IMPRESSION: Scoliosis and multilevel degenerative changes outlined above. In this patient with left flank pain, there is potential for left-sided neural compression in the region of right convex curvature at T12-L1, L1-2 and L2-3. Multifactorial stenosis at L4-5 with considerable potential for neural compression, particularly on the right. Bilateral lateral recess and foraminal stenosis at L5-S1, right more than left. Electronically Signed   By: Nelson Chimes M.D.   On: 12/30/2020 17:48    Procedures Procedures   Medications Ordered in ED Medications  iohexol (OMNIPAQUE) 300 MG/ML solution 100 mL (100 mLs Intravenous Contrast Given 12/30/20 1652)    ED Course  I have reviewed the triage vital signs and the nursing notes.  Pertinent labs & imaging results that were available during my care of the patient were reviewed by me and considered in my medical decision making (see chart for details).    MDM Rules/Calculators/A&P                           Patient presents with 2-3 months of dull aching pain that has worsened to moderate-severe burning pain over the past three days.  He reports that the pain worsens with physical activity/movement and alleviated with rest. Does occasionally feel pain while resting and reports it is "opportunistic". He has tried tylenol which has also helped some but does not completely relieve it. He has ben evaluated previously for same pain and was prescribed lidocaine patches which did not improve pain at all.   UA negative for nitrites. No burning with urination. No CVA tenderness. CBC negative.  and pyelo unlikely.  He has a history of radical prostatectomy, Weak stream, but denies acute changes in urination. BMP not showing any increase in BUN or Cr. Urinary retention with AKI seems unlikely. He has had  shingles in the past, reports this pain is different. No obvious rashes seen on exam.  He is tender to palpation over his left lower back, with tenderness to palpation to the left paraspinal muscles most prominent over iliac crest. Denies history of fall/trauma.  Given patient's history of prostatectomy. Ct abd and pelvis and CT L- spine to r/o stone vs fracture vs mets to bone. 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 No acute abnormalities noted and felt safe for discharge home. He will needs to f/u with primary care physician.  Final Clinical Impression(s) / ED Diagnoses Final diagnoses:  Back pain    Rx / DC Orders ED Discharge Orders          Ordered    methocarbamol (ROBAXIN) 500 MG tablet  3 times daily  PRN        12/30/20 1912             Delene Ruffini, MD 12/31/20 [place]    Tegeler, Gwenyth Allegra, MD 01/01/21 [phone]

## 2021-01-07 DIAGNOSIS — M48061 Spinal stenosis, lumbar region without neurogenic claudication: Secondary | ICD-10-CM | POA: Insufficient documentation

## 2021-01-07 DIAGNOSIS — R400 Somnolence: Secondary | ICD-10-CM | POA: Insufficient documentation

## 2021-08-03 ENCOUNTER — Ambulatory Visit: Payer: Medicare PPO | Admitting: Sports Medicine

## 2021-08-03 ENCOUNTER — Other Ambulatory Visit: Payer: Self-pay

## 2021-08-03 DIAGNOSIS — M79674 Pain in right toe(s): Secondary | ICD-10-CM | POA: Diagnosis not present

## 2021-08-03 DIAGNOSIS — B351 Tinea unguium: Secondary | ICD-10-CM | POA: Diagnosis not present

## 2021-08-03 DIAGNOSIS — L84 Corns and callosities: Secondary | ICD-10-CM

## 2021-08-03 DIAGNOSIS — I739 Peripheral vascular disease, unspecified: Secondary | ICD-10-CM

## 2021-08-03 DIAGNOSIS — M79675 Pain in left toe(s): Secondary | ICD-10-CM | POA: Diagnosis not present

## 2021-08-03 NOTE — Progress Notes (Signed)
Subjective: ?Joseph Nelson is a 81 y.o. male patient seen today in office with complaint of mildly painful thickened and elongated toenails; unable to trim. Patient also reports that he gets some calluses at the big toe joints that sometimes gets very sore when he is walking and standing.  Denies history of Diabetes or neuropathy but has a significant history of PVD and Parkinson's.  Patient has no other pedal complaints at this time.  ? ?Patient is assisted by wife this visit. ? ?Patient Active Problem List  ? Diagnosis Date Noted  ? PVD (peripheral vascular disease) ([place]) 02/13/2020  ? Low back pain 01/23/2019  ? Pain in both feet 11/05/2018  ? Groin swelling 10/22/2018  ? S/P inguinal hernia repair 10/03/2018  ? Pure hypercholesterolemia 10/26/2017  ? 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 Dermatochalasis of both upper eyelids 06/23/2017  ? Lattice degeneration, right 06/23/2017  ? Nuclear sclerotic cataract of both eyes 06/23/2017  ? Posterior subcapsular age-related cataract, left eye 06/23/2017  ? Posterior vitreous detachment of both eyes 06/23/2017  ? Presbyopia of both eyes 06/23/2017  ? Cervical disc disorder 04/11/2017  ? HLD (hyperlipidemia) 04/11/2017  ? Supraventricular tachycardia ([place]) 04/11/2017  ? Kyphosis of thoracic region 10/14/2015  ? Atherosclerosis of native coronary artery of native heart without angina pectoris 01/27/2015  ? Cervical post-laminectomy syndrome 04/29/2013  ? Routine history and physical examination of adult 06/07/2012  ? Parkinson's disease ([place]) 11/09/2011  ? 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 Anxiety state 02/02/2011  ? ? ?Current Outpatient Medications on File Prior to Visit  ?Medication Sig Dispense Refill  ? celecoxib (CELEBREX) 200 MG capsule Take by mouth.    ? DULoxetine HCl 40 MG CPEP Take 1 tablet by mouth daily.    ? hydrOXYzine (ATARAX) 10 MG tablet Take by mouth.    ? lidocaine (LIDODERM) 5 % Apply 1 patch to 3 painful  areas if needed, 12/24 hours per day.    ? rosuvastatin (CRESTOR) 10 MG tablet Take by mouth.    ? tiZANidine (ZANAFLEX) 2 MG tablet Take 1-2 tabs twice daily as needed for pain, insomnia.    ? ALPRAZolam (XANAX) 0.25 MG tablet Take 1 tablet by mouth daily as needed.    ? Carbidopa-Levodopa ER (SINEMET CR) 25-100 MG tablet controlled release Take by mouth.    ? carbidopa-levodopa-entacapone (STALEVO) 25-100-200 MG tablet TAKE 1 TABLET BY MOUTH 5 TIMES A DAY    ? celecoxib (CELEBREX) 100 MG capsule     ? Cholecalciferol 125 MCG (5000 UT) capsule TAKE 5000 UNITS BY MOUTH DAILY.    ? diclofenac (FLECTOR) 1.3 % PTCH SMARTSIG:Topical    ? DULoxetine HCl 40 MG CPEP     ? ezetimibe (ZETIA) 10 MG tablet Take by mouth.    ? gabapentin (NEURONTIN) 100 MG capsule     ? ketoconazole (NIZORAL) 2 % shampoo Apply topically.    ? nitroGLYCERIN (NITROSTAT) 0.4 MG SL tablet PLACE 1 TABLET UNDER THE TONGUE EVERY 5 MINUTES AS NEEDED FOR CHEST PAIN.    ? nystatin cream (MYCOSTATIN) Apply topically.    ? triamcinolone cream (KENALOG) 0.1 % Apply topically.    ? ?No current facility-administered medications on file prior to visit.  ? ? ?Allergies  ?Allergen Reactions  ? Clonazepam Other (See Comments)  ?  Excessive drowsy  ?Excessive drowsiness. ?Excessive drowsy  ?  ? Penicillins Anaphylaxis and Swelling  ? Rabeprazole Anaphylaxis and Swelling  ?  Throat swelling ?  ?  Ciprofloxacin Nausea Only  ? Clarithromycin Other (See Comments)  ?  Unknown.  ? Codeine Other (See Comments)  ?  Jerking.   ? Cyclobenzaprine Other (See Comments)  ?  Flushing of the skin ?Hot flash ?Hot flashes.  ?Flushing of the skin ?  ? Diphenhydramine Hcl Other (See Comments)  ?  "heart went crazy" ?"heart went crazy". ?"heart went crazy" ?  ? Ciprofloxacin Hcl Rash  ?  Cannot tolerate orally. OK with eye drops. ?  ? Citalopram Anxiety, Nausea Only and Other (See Comments)  ?  Makes nervous ?  ? Erythromycin Other (See Comments) and Rash  ?  Unknown rxn ?  ?  Meperidine Rash  ? Minocycline Other (See Comments) and Nausea Only  ?  SIDE EFFECT- NAUSEA  ?SIDE EFFECT- NAUSEA  ?  ? Nabumetone Anxiety and Other (See Comments)  ?  "my brain felt really weird and agitated" ?"my brain felt really weird and agitated" ?  ? Other Rash  ? Prednisone Other (See Comments) and Rash  ?  Hot flash ?Hot flashes  ?  ? Propoxyphene Rash  ? Sulfa Antibiotics Rash and Other (See Comments)  ? Tramadol Rash  ? ? ?Objective: ?Physical Exam ? ?General: Well developed, nourished, no acute distress, awake, alert and oriented x 3 ? ?Vascular: Dorsalis pedis artery 1/4 bilateral, Posterior tibial artery 0/4 bilateral likely due to edema at ankles, no signs of ischemia or rest pain noted, skin temperature warm to warm proximal to distal bilateral lower extremities, mild varicosities, minimal pedal hair present bilateral. ? ?Neurological: Gross sensation present via light touch bilateral.  ? ?Dermatological: Skin is warm, dry, and supple bilateral, Nails 1-10 are tender, long, thick, and discolored with mild subungal debris, no webspace macerations present bilateral, no open lesions present bilateral, + callus/hyperkeratotic tissue present bilateral first MPJs. No signs of infection bilateral. ? ?Musculoskeletal: Prominent first metatarsal heads bilateral and shuffling gait secondary to Parkinson's. ? ?Assessment and Plan:  ?Problem List Items Addressed This Visit   ? ?  ? Cardiovascular and Mediastinum  ? PVD (peripheral vascular disease) ([place])  ? Relevant Medications  ? rosuvastatin (CRESTOR) 10 MG tablet  ? ?Other Visit Diagnoses   ? ? Pain due to onychomycosis of toenails of both feet    -  Primary  ? Relevant Medications  ? ketoconazole (NIZORAL) 2 % shampoo  ? nystatin cream (MYCOSTATIN)  ? Callus      ? ?  ?  ? ? ?-Examined patient.  ?-Discussed treatment options for painful mycotic nails and callus. ?-Mechanically debrided and reduced mycotic nails with sterile nail nipper and dremel nail file  without incident. ?-Mechanically debrided callus x2 at medial first MPJs bilaterally using a sterile 15 blade without incident ?-Encouraged elevation of legs to assist with any edema control and provided patient gentle compression using Surgigrip compression sleeves advised patient that if this works well may benefit long-term from additional support from compression garments ?-Recommend good supportive shoes daily for foot type and dispense foot miracle cream for dry skin ?-Patient to return in 3 months for follow up nail and callus care with Dr. Blenda Mounts or sooner if symptoms worsen. ? ?Landis Martins, DPM ? ?

## 2021-10-12 DIAGNOSIS — M159 Polyosteoarthritis, unspecified: Secondary | ICD-10-CM | POA: Insufficient documentation

## 2021-11-02 IMAGING — US US EXTREM LOW VENOUS
1 series · 13 of 24 positions shown · non-contrast
Comparison: None.

CLINICAL DATA: Lower extremity pain bilaterally

EXAM:
BILATERAL LOWER EXTREMITY VENOUS DUPLEX ULTRASOUND
TECHNIQUE: Gray-scale sonography with graded compression, as well as color
Doppler and duplex ultrasound were performed to evaluate the lower
extremity deep venous systems from the level of the common femoral
vein and including the common femoral, femoral, profunda femoral,
popliteal and calf veins including the posterior tibial, peroneal
and gastrocnemius veins when visible. The superficial great
saphenous vein was also interrogated. Spectral Doppler was utilized
to evaluate flow at rest and with distal augmentation maneuvers in
the common femoral, femoral and popliteal veins.

[Series 1: us extrem low venous · 13 of 68 slices shown]
[im 1/68]
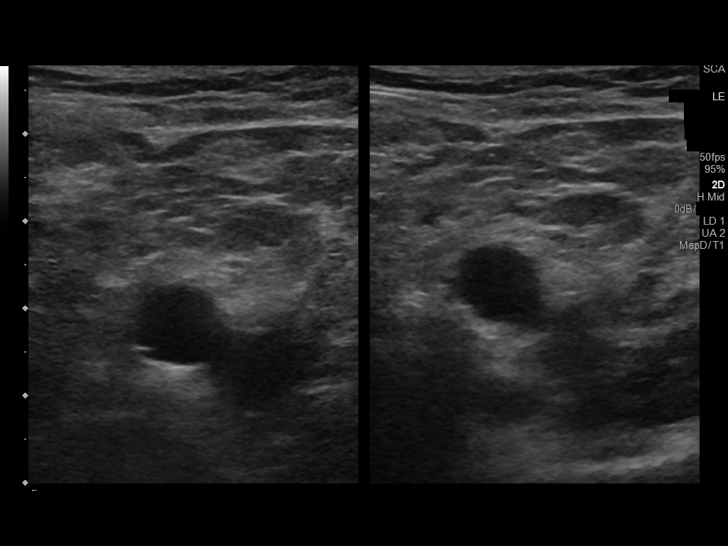
[im 6/68]
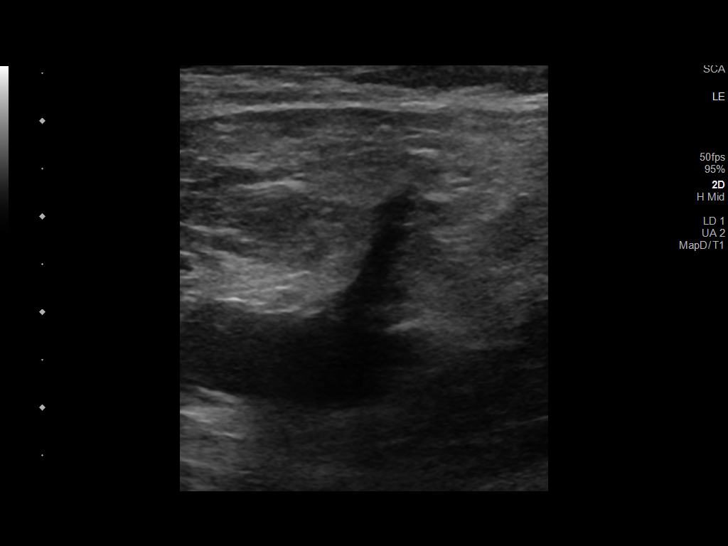
[im 12/68]
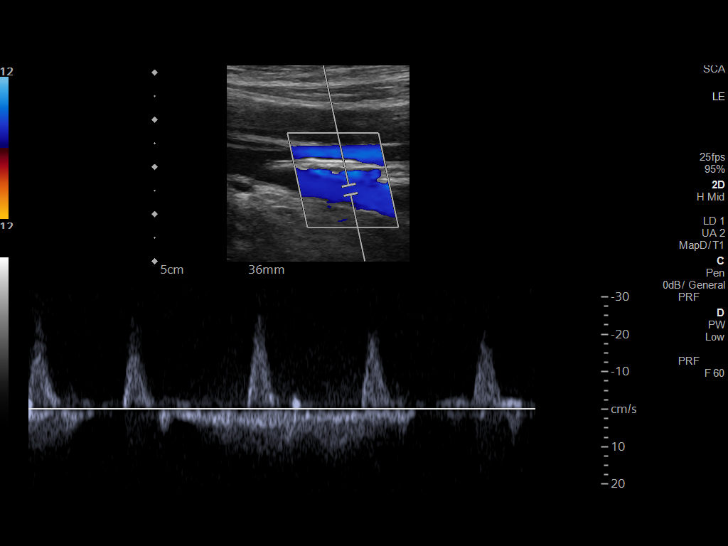
[im 18/68]
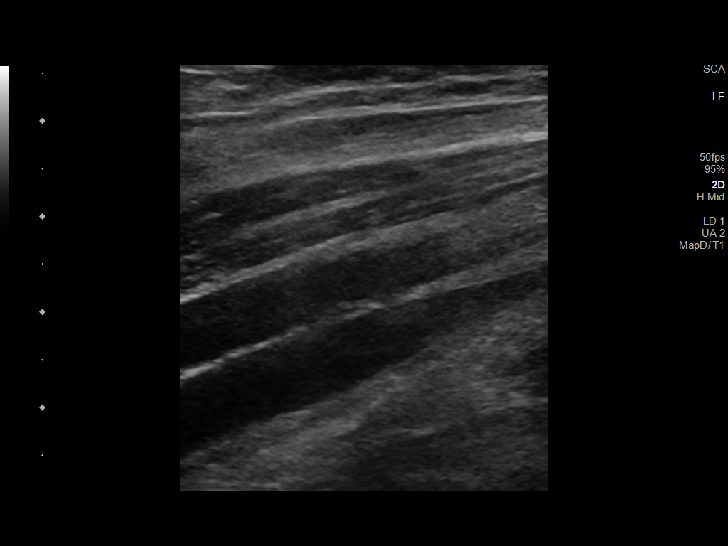
[im 24/68]
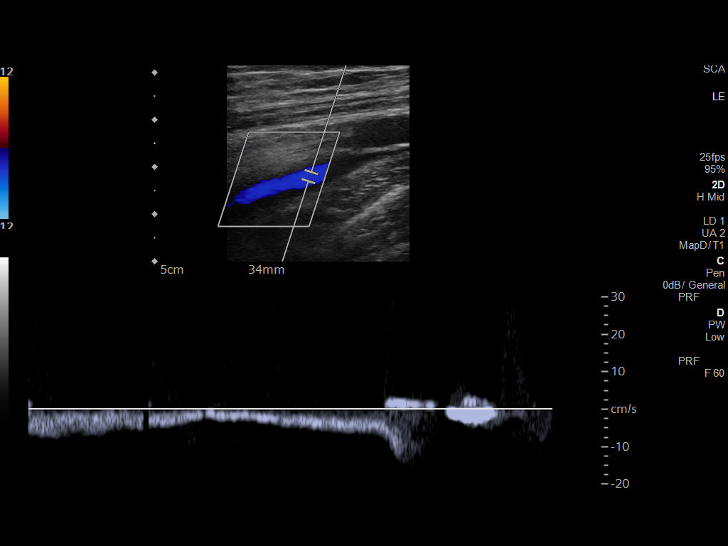
[im 30/68]
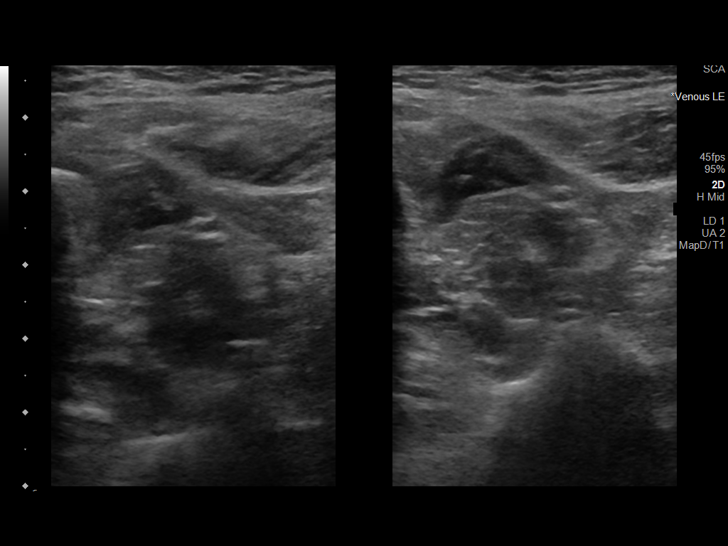
[im 35/68]
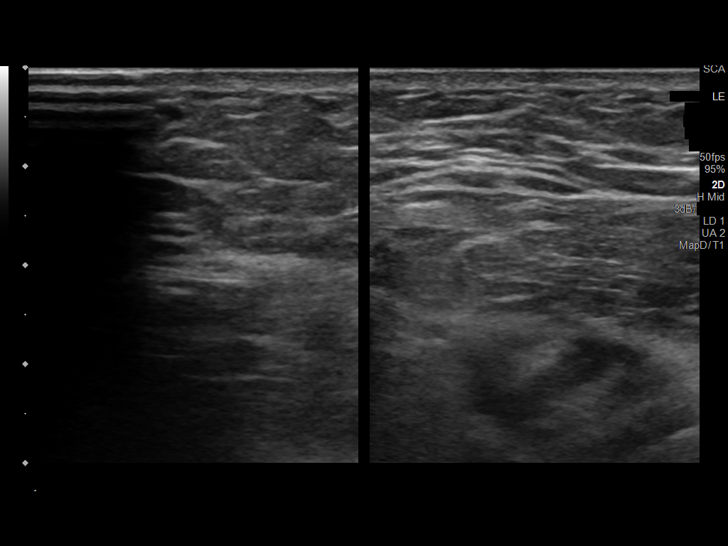
[im 38/68]
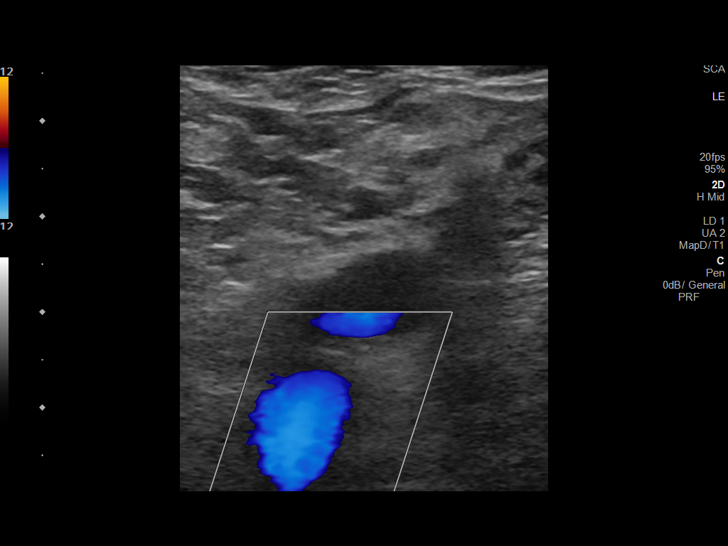
[im 44/68]
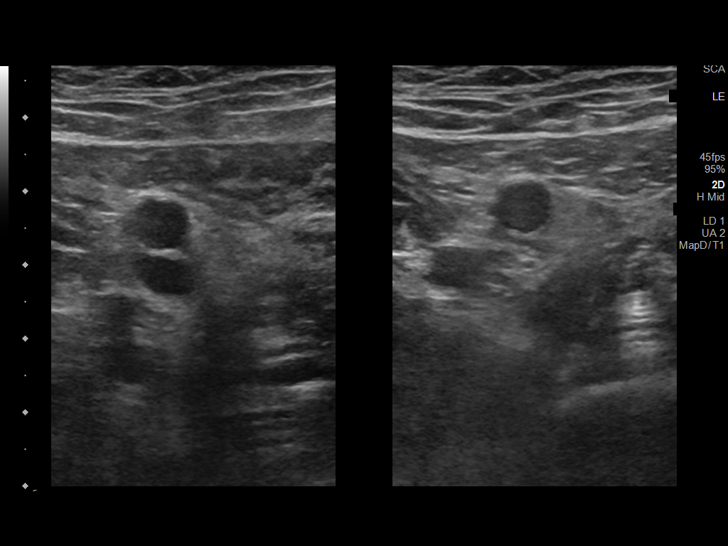
[im 50/68]
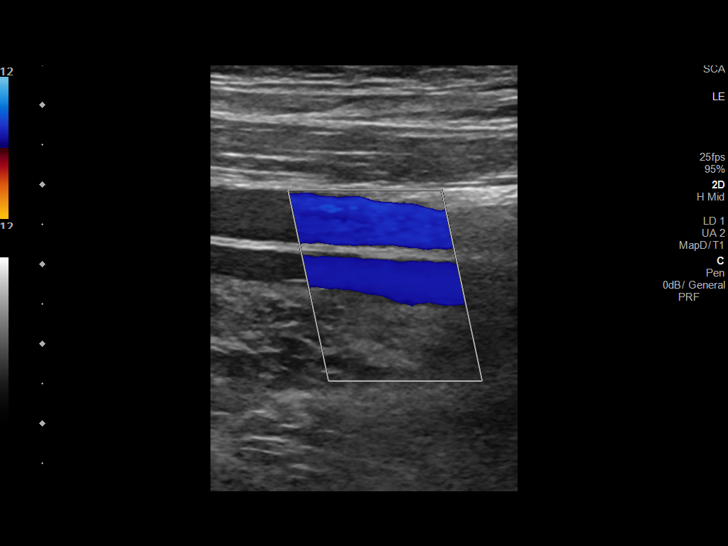
[im 56/68]
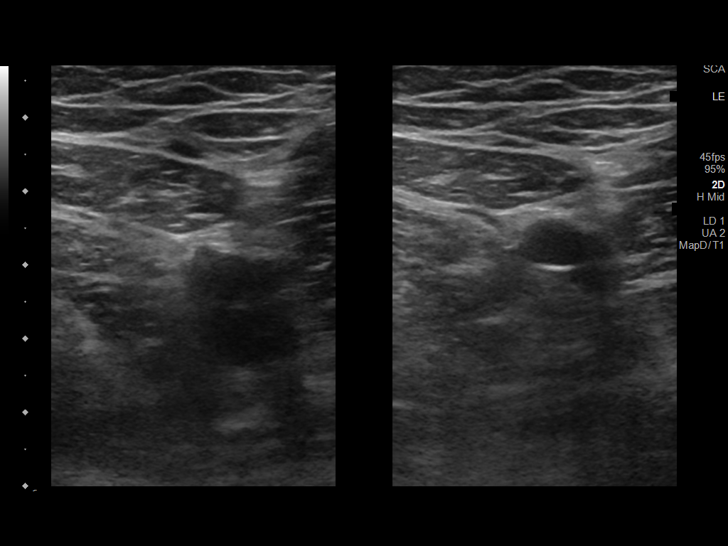
[im 62/68]
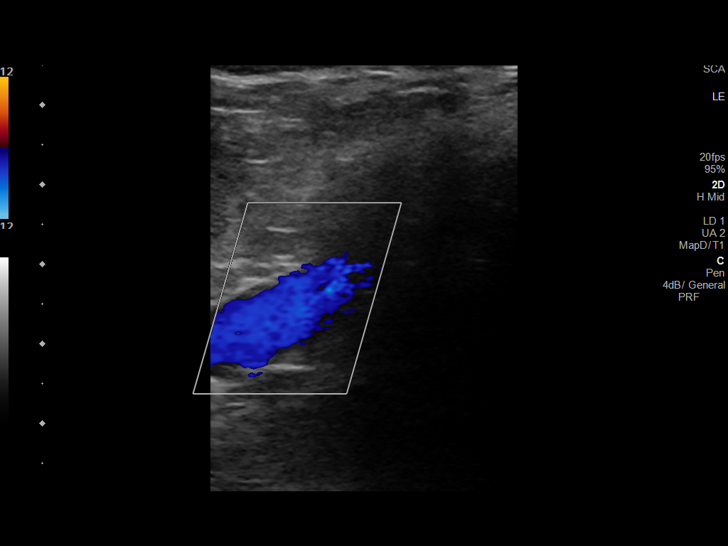
[im 68/68]
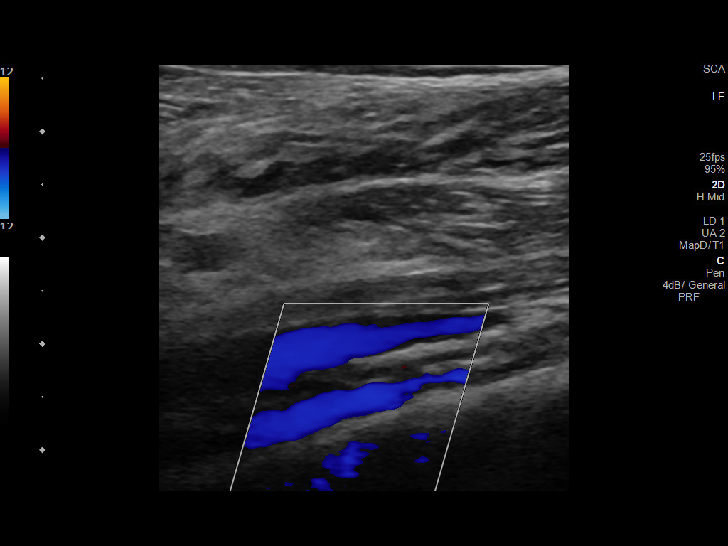

[13 of 24 positions shown; findings below may reference images not displayed]

FINDINGS: RIGHT LOWER EXTREMITY

Common Femoral Vein: No evidence of thrombus. Normal
compressibility, respiratory phasicity and response to augmentation.

Saphenofemoral Junction: No evidence of thrombus. Normal
compressibility and flow on color Doppler imaging.

Profunda Femoral Vein: No evidence of thrombus. Normal
compressibility and flow on color Doppler imaging.

Femoral Vein: No evidence of thrombus. Normal compressibility,
respiratory phasicity and response to augmentation.

Popliteal Vein: No evidence of thrombus. Normal compressibility,
respiratory phasicity and response to augmentation.

Calf Veins: No evidence of thrombus. Normal compressibility and flow
on color Doppler imaging.

Superficial Great Saphenous Vein: No evidence of thrombus. Normal
compressibility.

Venous Reflux:  None.

Other Findings:  None.

LEFT LOWER EXTREMITY

Common Femoral Vein: No evidence of thrombus. Normal
compressibility, respiratory phasicity and response to augmentation.

Saphenofemoral Junction: No evidence of thrombus. Normal
compressibility and flow on color Doppler imaging.

Profunda Femoral Vein: No evidence of thrombus. Normal
compressibility and flow on color Doppler imaging.

Femoral Vein: No evidence of thrombus. Normal compressibility,
respiratory phasicity and response to augmentation.

Popliteal Vein: No evidence of thrombus. Normal compressibility,
respiratory phasicity and response to augmentation.

Calf Veins: No evidence of thrombus. Normal compressibility and flow
on color Doppler imaging.

Superficial Great Saphenous Vein: No evidence of thrombus. Normal
compressibility.

Venous Reflux:  None.

Other Findings:  None.
IMPRESSION: No evidence of deep venous thrombosis in either lower extremity.

## 2021-11-08 ENCOUNTER — Encounter: Payer: Self-pay | Admitting: Podiatry

## 2021-11-08 ENCOUNTER — Ambulatory Visit: Payer: Medicare PPO | Admitting: Podiatry

## 2021-11-08 DIAGNOSIS — I739 Peripheral vascular disease, unspecified: Secondary | ICD-10-CM

## 2021-11-08 DIAGNOSIS — M79675 Pain in left toe(s): Secondary | ICD-10-CM | POA: Diagnosis not present

## 2021-11-08 DIAGNOSIS — M79674 Pain in right toe(s): Secondary | ICD-10-CM | POA: Diagnosis not present

## 2021-11-08 DIAGNOSIS — L84 Corns and callosities: Secondary | ICD-10-CM

## 2021-11-08 DIAGNOSIS — B351 Tinea unguium: Secondary | ICD-10-CM | POA: Diagnosis not present

## 2022-02-07 ENCOUNTER — Ambulatory Visit: Payer: Medicare PPO | Admitting: Podiatry

## 2022-05-26 ENCOUNTER — Other Ambulatory Visit: Payer: Self-pay | Admitting: Orthopedic Surgery

## 2022-05-26 DIAGNOSIS — M47816 Spondylosis without myelopathy or radiculopathy, lumbar region: Secondary | ICD-10-CM

## 2022-06-09 ENCOUNTER — Ambulatory Visit
Admission: RE | Admit: 2022-06-09 | Discharge: 2022-06-09 | Disposition: A | Discharge status: Home / Self Care | Payer: Medicare PPO | Source: Ambulatory Visit | Attending: Orthopedic Surgery | Admitting: Orthopedic Surgery

## 2022-06-09 DIAGNOSIS — M47816 Spondylosis without myelopathy or radiculopathy, lumbar region: Secondary | ICD-10-CM

## 2022-06-28 IMAGING — CT CT ABD-PELV W/ CM
2 of 5 series · 14 of 46 positions shown, 16 images · IV contrast (Omnipaque)
Comparison: None.

CLINICAL DATA: Pt c/o left flank pain x 3 days-when asked if
urinary sx states

EXAM:
CT ABDOMEN AND PELVIS WITH CONTRAST
TECHNIQUE: Multidetector CT imaging of the abdomen and pelvis was performed
using the standard protocol following bolus administration of
intravenous contrast.
CONTRAST:  100mL OMNIPAQUE IOHEXOL 300 MG/ML  SOLN

[Series 2: axial st · axial · 0.83mm/px · z∈[-473,-73]mm · 11 of 90 slices shown, 13 images]
[im 5/90  soft-tissue]
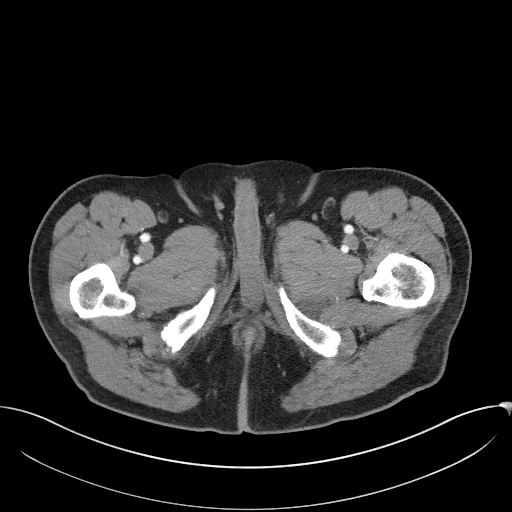
[im 5/90  bone]
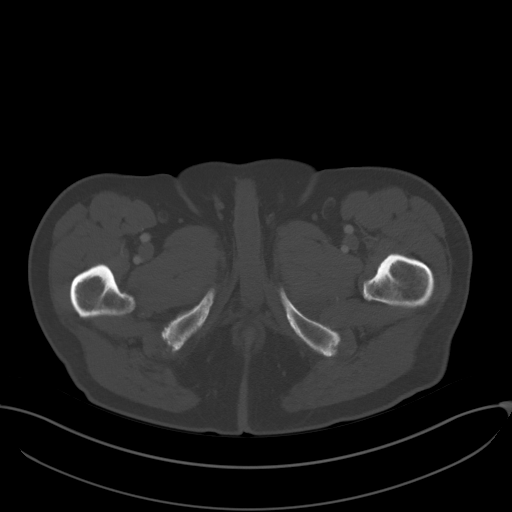
[im 15/90  soft-tissue]
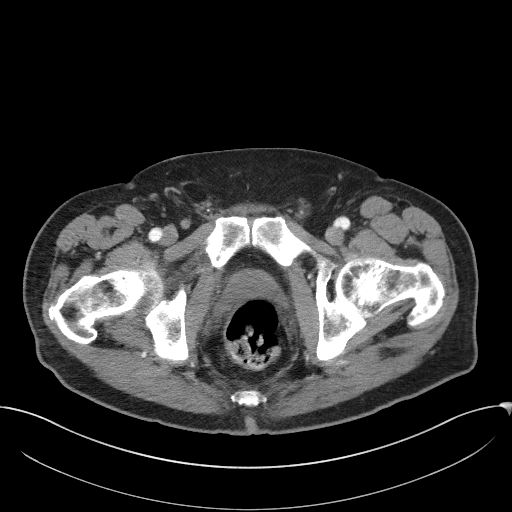
[im 20/90  soft-tissue]
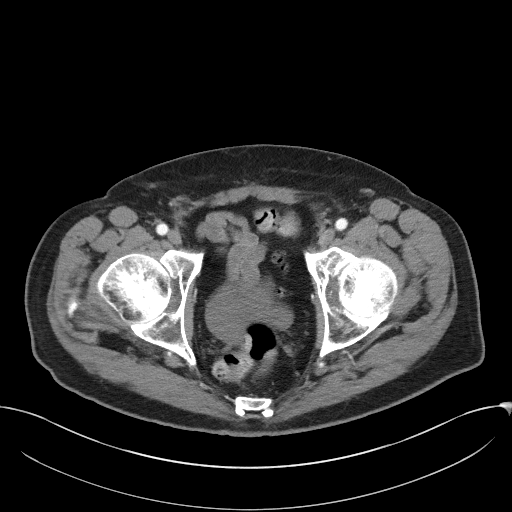
[im 30/90  soft-tissue]
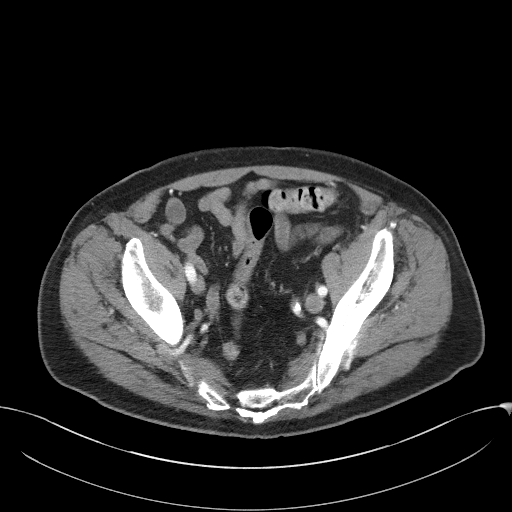
[im 35/90  soft-tissue]
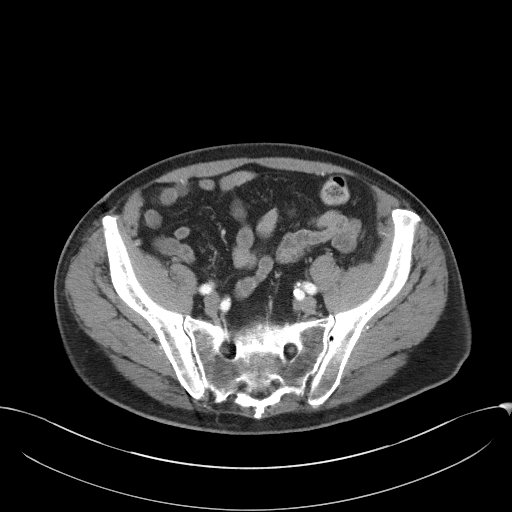
[im 45/90  soft-tissue]
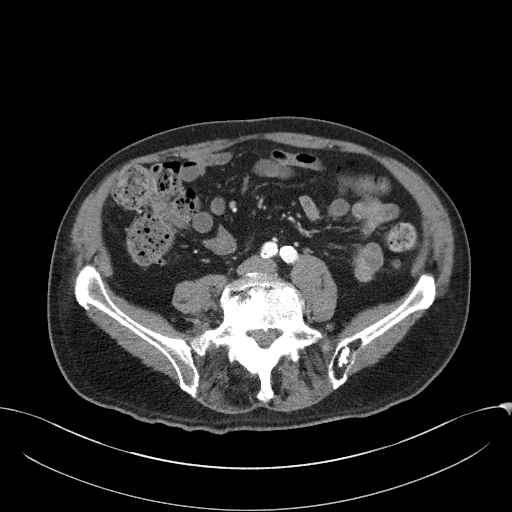
[im 55/90  soft-tissue]
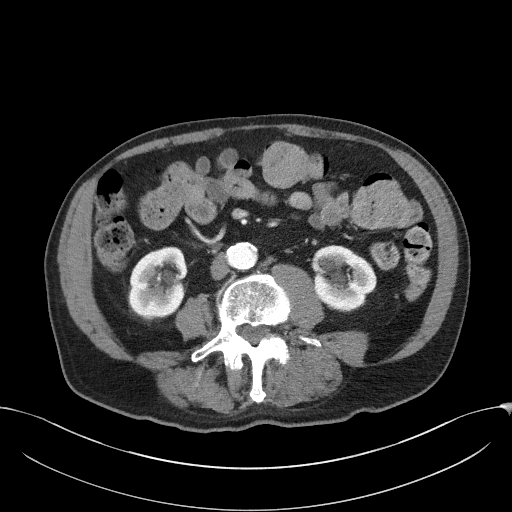
[im 60/90  soft-tissue]
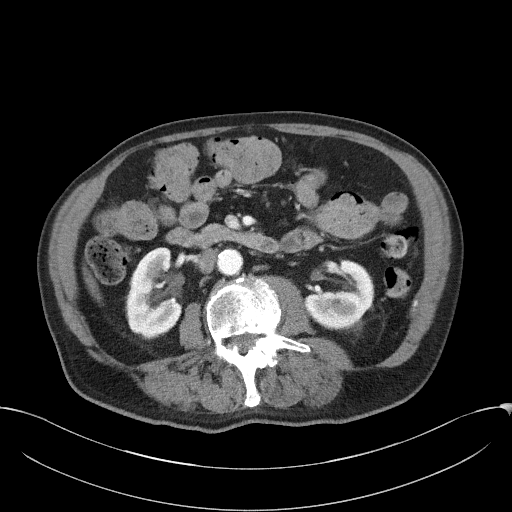
[im 70/90  soft-tissue]
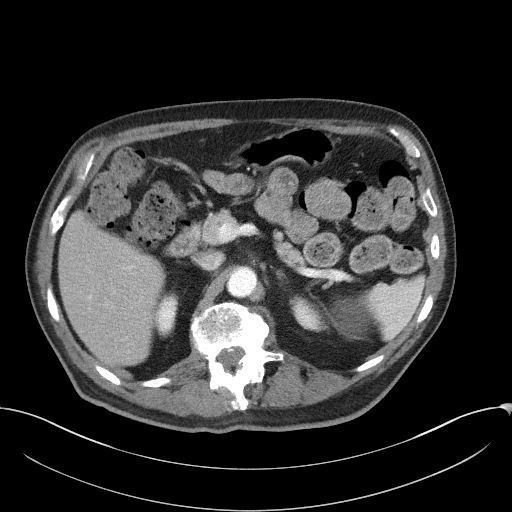
[im 70/90  bone]
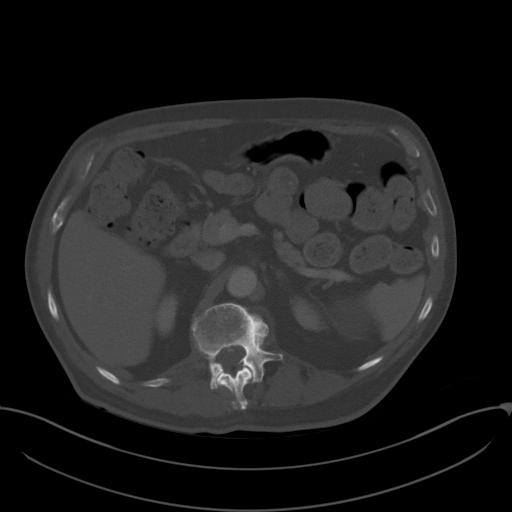
[im 75/90  soft-tissue]
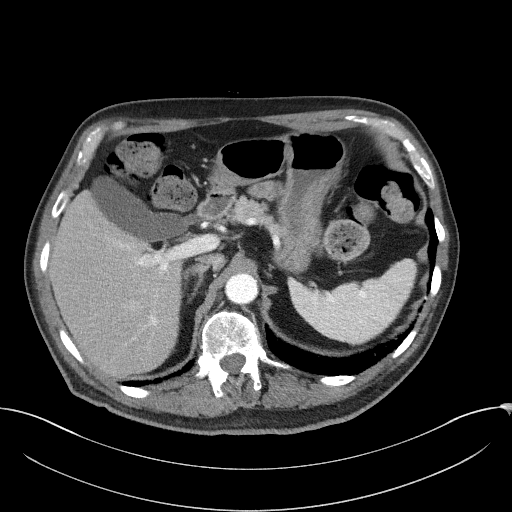
[im 85/90  soft-tissue]
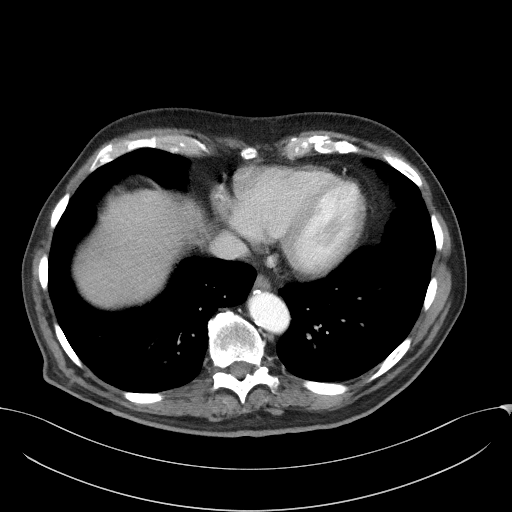

[Series 5: coronal st · coronal · 0.79mm/px · 3 of 101 slices shown]
[im 34/101  soft-tissue]
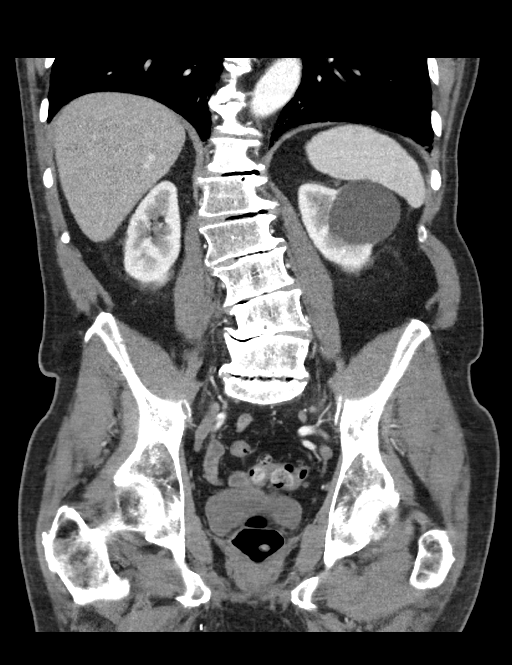
[im 45/101  soft-tissue]
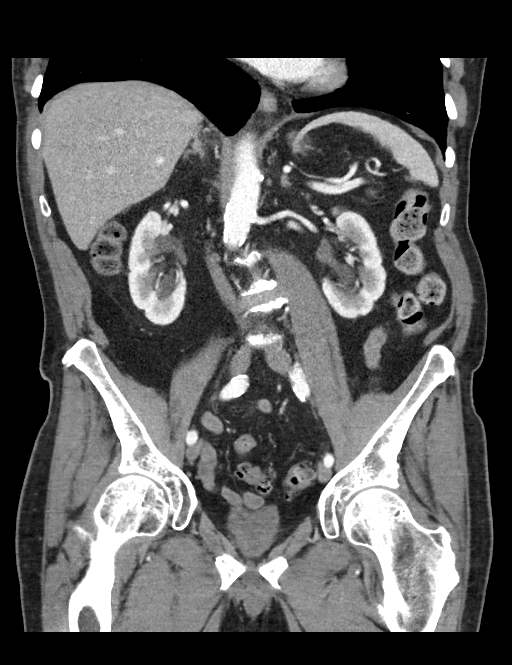
[im 56/101  soft-tissue]
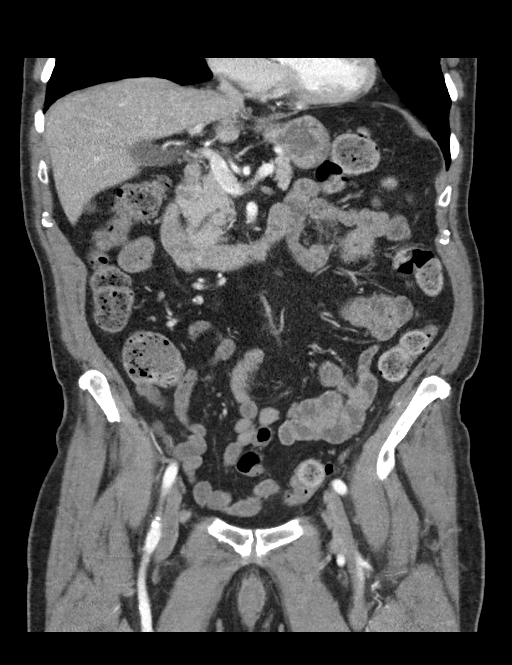

[14 of 46 positions shown; findings below may reference images not displayed]

FINDINGS: Lower chest: 5 mm left lower lobe pulmonary nodule ([DATE]). 3 mm
right lower lobe pulmonary nodule ([DATE]). Coronary calcification.

Hepatobiliary: No focal liver abnormality. No gallstones,
gallbladder wall thickening, or pericholecystic fluid. No biliary
dilatation.

Pancreas: No focal lesion. Normal pancreatic contour. No surrounding
inflammatory changes. No main pancreatic ductal dilatation.

Spleen: Normal in size without focal abnormality.

Adrenals/Urinary Tract:

Left adrenal gland calcifications.  No adrenal nodule bilaterally.

Bilateral kidneys enhance symmetrically. There is a 5.8 cm fluid
density lesion within the left kidney that likely represents a
simple renal cyst.

No hydronephrosis. No hydroureter.

The urinary bladder is unremarkable.

On delayed imaging, there is no urothelial wall thickening and there
are no filling defects in the opacified portions of the bilateral
collecting systems or ureters.

Stomach/Bowel: Stomach is within normal limits. No evidence of bowel
wall thickening or dilatation. Diffuse sigmoid diverticulosis.
Appendix appears normal.

Vascular/Lymphatic: No abdominal aorta or iliac aneurysm. Severe
atherosclerotic plaque of the aorta and its branches. No abdominal,
pelvic, or inguinal lymphadenopathy.

Reproductive: Status post prostatectomy.

Other: No intraperitoneal free fluid. No intraperitoneal free gas.
No organized fluid collection.

Nonspecific adjacent 0.6 cm and 0.2 cm soft tissue densities along
the posterior right hepatic lobe ([DATE], [DATE]).

Musculoskeletal:

No abdominal wall hernia or abnormality.

There is a lytic lesion of the iliac bone with associated central
calcification ([DATE]). Lesion appears to have a narrow zone of
transition and is well-defined. Otherwise no suspicious lytic or
blastic osseous lesions. No acute displaced fracture.

Levoscoliosis of the lumbar spine with compensatory dextroscoliosis
of the thoracolumbar spine. Multilevel severe degenerative changes
of the spine.

Please see separately dictated CT lumbar spine 12/30/2020.
IMPRESSION: 1. Diffuse sigmoid diverticulosis with no acute diverticulitis.
2. Benign appearing lytic lesion of the left iliac bone likely
representing an intraosseous lipoma with central scar.
3. Nonspecific 0.6 cm and 0.2 cm soft tissue densities along the
posterior right hepatic lobe.
Comparison with prior cross-sectional imaging may be of value.
4. A 5 mm left lower and a 3 mm right lower pulmonary nodule. No
follow-up needed if patient is low-risk (and has no known or
suspected primary neoplasm). Non-contrast chest CT can be considered
in 12 months if patient is high-risk. This recommendation follows
the consensus statement: Guidelines for Management of Incidental
Pulmonary Nodules Detected on CT Images: From the [HOSPITAL]
5. Aortic Atherosclerosis ([id number]).
6. Please see separately dictated CT lumbar spine 12/30/2020.

These results were called by telephone at the time of interpretation
on 12/30/2020 at [DATE] to provider SUNAGAWA IKUE , who
verbally acknowledged these results.

## 2022-08-09 ENCOUNTER — Emergency Department (HOSPITAL_BASED_OUTPATIENT_CLINIC_OR_DEPARTMENT_OTHER)
Admission: EM | Admit: 2022-08-09 | Discharge: 2022-08-09 | Disposition: A | Discharge status: Home / Self Care | Payer: Medicare PPO | Attending: Emergency Medicine | Admitting: Emergency Medicine

## 2022-08-09 ENCOUNTER — Emergency Department (HOSPITAL_BASED_OUTPATIENT_CLINIC_OR_DEPARTMENT_OTHER): Payer: Medicare PPO

## 2022-08-09 ENCOUNTER — Encounter (HOSPITAL_BASED_OUTPATIENT_CLINIC_OR_DEPARTMENT_OTHER): Payer: Self-pay | Admitting: Urology

## 2022-08-09 ENCOUNTER — Other Ambulatory Visit: Payer: Self-pay

## 2022-08-09 DIAGNOSIS — K4090 Unilateral inguinal hernia, without obstruction or gangrene, not specified as recurrent: Secondary | ICD-10-CM | POA: Diagnosis present

## 2022-08-09 DIAGNOSIS — G20C Parkinsonism, unspecified: Secondary | ICD-10-CM | POA: Diagnosis not present

## 2022-08-09 DIAGNOSIS — Z7982 Long term (current) use of aspirin: Secondary | ICD-10-CM | POA: Diagnosis not present

## 2022-08-09 LAB — CBC WITH DIFFERENTIAL/PLATELET
Abs Immature Granulocytes: 0.03 10*3/uL (ref 0.00–0.07)
Basophils Absolute: 0 10*3/uL (ref 0.0–0.1)
Basophils Relative: 1 %
Eosinophils Absolute: 0.1 10*3/uL (ref 0.0–0.5)
Eosinophils Relative: 2 %
HCT: 43.7 % (ref 39.0–52.0)
Hemoglobin: 14.6 g/dL (ref 13.0–17.0)
Immature Granulocytes: 0 %
Lymphocytes Relative: 21 %
Lymphs Abs: 1.8 10*3/uL (ref 0.7–4.0)
MCH: 31.1 pg (ref 26.0–34.0)
MCHC: 33.4 g/dL (ref 30.0–36.0)
MCV: 93 fL (ref 80.0–100.0)
Monocytes Absolute: 0.8 10*3/uL (ref 0.1–1.0)
Monocytes Relative: 10 %
Neutro Abs: 5.7 10*3/uL (ref 1.7–7.7)
Neutrophils Relative %: 66 %
Platelets: 202 10*3/uL (ref 150–400)
RBC: 4.7 MIL/uL (ref 4.22–5.81)
RDW: 13.1 % (ref 11.5–15.5)
WBC: 8.6 10*3/uL (ref 4.0–10.5)
nRBC: 0 % (ref 0.0–0.2)

## 2022-08-09 LAB — BASIC METABOLIC PANEL
Anion gap: 9 (ref 5–15)
BUN: 16 mg/dL (ref 8–23)
CO2: 25 mmol/L (ref 22–32)
Calcium: 9.2 mg/dL (ref 8.9–10.3)
Chloride: 101 mmol/L (ref 98–111)
Creatinine, Ser: 0.73 mg/dL (ref 0.61–1.24)
GFR, Estimated: >60 mL/min (ref >60)
Glucose, Bld: 94 mg/dL (ref 70–99)
Potassium: 3.9 mmol/L (ref 3.5–5.1)
Sodium: 135 mmol/L (ref 135–145)

## 2022-08-09 LAB — URINALYSIS, ROUTINE W REFLEX MICROSCOPIC
Bilirubin Urine: NEGATIVE
Glucose, UA: 100 mg/dL — AB
Hgb urine dipstick: NEGATIVE
Ketones, ur: 15 mg/dL — AB
Leukocytes,Ua: NEGATIVE
Nitrite: NEGATIVE
Protein, ur: NEGATIVE mg/dL
Specific Gravity, Urine: 1.025 (ref 1.005–1.030)
pH: 6 (ref 5.0–8.0)

## 2022-08-09 NOTE — ED Provider Notes (Signed)
[facility] [place] [facility] Provider Note   CSN: [id number] Arrival date & time: 08/09/22  1004     History  Chief Complaint  Patient presents with   Inguinal Hernia    Joseph Nelson is a 82 y.o. male.  Patient is a 82 year old male with a history of Parkinson's, venous insufficiency and prior prostate cancer who presents with pain in his left inguinal canal.  He is concerned about a hernia.  He has had a prior hernia on the right side that was repaired at [facility].  He states that intermittently he will feel a twinge of pain in his left inguinal area.  Today got more significant and he saw a bulge in the area.  He had pain that was radiating up into his abdomen and some associated nausea.  No vomiting.  No fevers.  No difficulty with urination.  He has some ongoing chronic constipation and takes milk of magnesia for this.  He says the abdominal pain lasted about an hour and a half but has since resolved although he does have some tenderness still on his inguinal area.       Home Medications Prior to Admission medications   Medication Sig Start Date End Date Taking? Authorizing Provider  acetaminophen (TYLENOL) 500 MG tablet Take by mouth.    [provider]  ALPRAZolam Duanne Moron) 0.25 MG tablet Take 1 tablet by mouth daily as needed. 10/17/18   [provider]  aspirin EC 81 MG tablet Take 1 tablet by mouth daily.    [provider]  Bartolo Darter COVID-19 AG HOME TEST KIT See admin instructions. 09/10/21   [provider]  Carbidopa-Levodopa ER (SINEMET CR) 25-100 MG tablet controlled release Take by mouth. 07/25/21   [provider]  carbidopa-levodopa-entacapone (STALEVO) 25-100-200 MG tablet TAKE 1 TABLET BY MOUTH 5 TIMES A DAY 07/08/14   [provider]  celecoxib (CELEBREX) 200 MG capsule Take by mouth. 10/12/21   [provider]  cetirizine (ZYRTEC) 10 MG tablet Take by mouth.     [provider]  Cholecalciferol 125 MCG (5000 UT) capsule TAKE 5000 UNITS BY MOUTH DAILY. 09/30/19   [provider]  diclofenac (FLECTOR) 1.3 % PTCH SMARTSIG:Topical 07/26/21   [provider]  DULoxetine HCl 40 MG CPEP Take 1 tablet by mouth daily. 07/22/21   [provider]  ezetimibe (ZETIA) 10 MG tablet Take by mouth. 10/11/19 10/10/20  [provider]  gabapentin (NEURONTIN) 100 MG capsule  10/03/19   [provider]  hydrOXYzine (ATARAX) 10 MG tablet Take by mouth. 11/05/20   [provider]  ketoconazole (NIZORAL) 2 % shampoo Apply topically. 05/25/21   [provider]  lidocaine (LIDODERM) 5 % Apply 1 patch to 3 painful areas if needed, 12/24 hours per day. 11/05/20   [provider]  nitroGLYCERIN (NITROSTAT) 0.4 MG SL tablet PLACE 1 TABLET UNDER THE TONGUE EVERY 5 MINUTES AS NEEDED FOR CHEST PAIN. 09/23/19   [provider]  nystatin cream (MYCOSTATIN) Apply topically. 05/25/21   [provider]  rosuvastatin (CRESTOR) 10 MG tablet Take by mouth. 04/14/21   [provider]  tiZANidine (ZANAFLEX) 2 MG tablet Take 1-2 tabs twice daily as needed for pain, insomnia. 07/22/21   [provider]  triamcinolone cream (KENALOG) 0.1 % Apply topically. 05/25/21   [provider]      Allergies    Clonazepam, Penicillins, Rabeprazole, Ciprofloxacin, Clarithromycin, Codeine, Cyclobenzaprine, Diphenhydramine hcl,  Ciprofloxacin hcl, Citalopram, Erythromycin, Meperidine, Minocycline, Nabumetone, Other, Prednisone, Propoxyphene, Sulfa antibiotics, and Tramadol    Review of Systems   Review of Systems  Constitutional:  Negative for chills, diaphoresis, fatigue and fever.  HENT:  Negative for congestion, rhinorrhea and sneezing.   Eyes: Negative.   Respiratory:  Negative for cough, chest tightness and shortness of breath.   Cardiovascular:  Negative for chest pain and leg swelling.   Gastrointestinal:  Positive for abdominal pain and nausea. Negative for blood in stool, diarrhea and vomiting.  Genitourinary:  Negative for difficulty urinating, flank pain, frequency, hematuria, scrotal swelling and testicular pain.  Musculoskeletal:  Negative for arthralgias and back pain.  Skin:  Negative for rash.  Neurological:  Negative for dizziness, speech difficulty, weakness, numbness and headaches.    Physical Exam Updated Vital Signs BP 128/89 (BP Location: Right Arm)   Pulse 69   Temp (!) 97.5 F (36.4 C)   Resp 18   Ht 6' (1.829 m)   Wt 80.3 kg   SpO2 100%   BMI 24.01 kg/m  Physical Exam Constitutional:      Appearance: He is well-developed.  HENT:     Head: Normocephalic and atraumatic.  Eyes:     Pupils: Pupils are equal, round, and reactive to light.  Cardiovascular:     Rate and Rhythm: Normal rate and regular rhythm.     Heart sounds: Normal heart sounds.  Pulmonary:     Effort: Pulmonary effort is normal. No respiratory distress.     Breath sounds: Normal breath sounds. No wheezing or rales.  Chest:     Chest wall: No tenderness.  Abdominal:     General: Bowel sounds are normal.     Palpations: Abdomen is soft.     Tenderness: There is no abdominal tenderness. There is no guarding or rebound.     Comments: Tenderness in his left inguinal canal with a palpable hernia.  Musculoskeletal:        General: Normal range of motion.     Cervical back: Normal range of motion and neck supple.  Lymphadenopathy:     Cervical: No cervical adenopathy.  Skin:    General: Skin is warm and dry.     Findings: No rash.  Neurological:     Mental Status: He is alert and oriented to person, place, and time.     ED Results / Procedures / Treatments   Labs (all labs ordered are listed, but only abnormal results are displayed) Labs Reviewed  URINALYSIS, ROUTINE W REFLEX MICROSCOPIC - Abnormal; Notable for the following components:      Result Value   Glucose, UA  100 (*)    Ketones, ur 15 (*)    All other components within normal limits  BASIC METABOLIC PANEL  CBC WITH DIFFERENTIAL/PLATELET    EKG None  Radiology CT ABDOMEN PELVIS WO CONTRAST  Result Date: 08/09/2022 CLINICAL DATA:  Hernia suspected, inguinal or femoral. Left-sided outpouching and pain. Previous hernia repair on the right. EXAM: CT ABDOMEN AND PELVIS WITHOUT CONTRAST TECHNIQUE: Multidetector CT imaging of the abdomen and pelvis was performed following the standard protocol without IV contrast. RADIATION DOSE REDUCTION: This exam was performed according to the departmental dose-optimization program which includes automated exposure control, adjustment of the mA and/or kV according to patient size and/or use of iterative reconstruction technique. COMPARISON:  12/30/2020 FINDINGS: Lower chest: Mild chronic scarring at the lung bases. No active process. Hepatobiliary: Liver parenchyma appears normal without contrast. No calcified gallstones.  Pancreas: Normal Spleen: Normal Adrenals/Urinary Tract: Adrenal glands are normal except for some chronic benign calcification on the left. No evidence of renal obstruction or mass. 1 mm nonobstructing stone at the lower pole on the left. Bladder is normal. Stomach/Bowel: Stomach and small intestine are normal. Normal appendix. Large amount of fecal matter throughout the colon. No evidence of obstructing lesion. Minimal diverticulosis. No sign of diverticulitis. Vascular/Lymphatic: Aortic atherosclerosis. No aneurysm. IVC is normal. No adenopathy. Reproductive: Normal Other: No free fluid or air. Musculoskeletal: Scoliosis and degenerative change of the spine, chronic. Chronic left iliac wing lipoma. No change in appearance of the inguinal regions. No evidence of recurrent hernia on the right. No true hernia suspected on the left. Mild asymmetric groin region fat. IMPRESSION: 1. No change in appearance of the inguinal regions. No evidence of recurrent hernia on  the right. No true hernia suspected on the left. Mild asymmetric fat in the groin region on the left. 2. Large amount of fecal matter throughout the colon. No evidence of obstructing lesion. Minimal diverticulosis. 3. Aortic atherosclerosis. 4. Scoliosis and degenerative change of the spine. Chronic left iliac wing lipoma. 5. 1 mm nonobstructing stone at the lower pole of the left kidney. Aortic Atherosclerosis (ICD10-I70.0). Electronically Signed   By: Nelson Chimes M.D.   On: 08/09/2022 11:09    Procedures Hernia reduction  Date/Time: 08/09/2022 11:58 AM  Performed by: Malvin Johns, MD Authorized by: Malvin Johns, MD  Consent: Verbal consent obtained. Risks and benefits: risks, benefits and alternatives were discussed Consent given by: patient Local anesthesia used: no  Anesthesia: Local anesthesia used: no  Sedation: Patient sedated: no  Patient tolerance: patient tolerated the procedure well with no immediate complications       Medications Ordered in ED Medications - No data to display  ED Course/ Medical Decision Making/ A&P                             Medical Decision Making Amount and/or Complexity of Data Reviewed Labs: ordered. Radiology: ordered.   Patient is a 82 year old male who presents with pain in his left inguinal canal.  Hernia was palpated.  I was able to reduce this.  However he was still having some pain in the area so it CT scan was performed.  This shows no visible hernia.  His pain actually resolved after this and he has a benign exam currently.  His labs are reviewed and are nonconcerning.  No evidence of urinary tract infection.  He was discharged home in good condition.  He was encouraged to wear his hernia belt.  He has this at bedside.  He was encouraged to follow-up with the surgeon regarding definitive repair.  He previously had a repair done at [facility] but requests referral to surgeon in the [facility] health group.  This was done.  He was  advised that he needs to call to make an appointment within the next couple days.  He was given strict return precautions and things to look for for incarcerated hernia.  He was also advised to use a stool softener regularly to keep his stools soft.  Final Clinical Impression(s) / ED Diagnoses Final diagnoses:  Left inguinal hernia    Rx / DC Orders ED Discharge Orders     None         Malvin Johns, MD 08/09/22 1200

## 2022-08-09 NOTE — ED Notes (Signed)
ED Provider at bedside. 

## 2022-08-09 NOTE — ED Notes (Signed)
Discharge paperwork reviewed entirely with patient, including Rx's and follow up care. Pain was under control. Pt verbalized understanding as well as all parties involved. No questions or concerns voiced at the time of discharge. No acute distress noted.   Pt ambulated out to PVA without incident or assistance.  

## 2022-08-09 NOTE — ED Notes (Signed)
Pt given snack and water per request, pt getting dressed at this time

## 2022-08-09 NOTE — ED Triage Notes (Signed)
Pt  noticed left inguinal hernia was poking out this am, mild pain noted per pt   H/o hernia repair on other side

## 2022-08-09 NOTE — Discharge Instructions (Signed)
Call to make an appointment to follow-up with the surgeon listed above.  Return to the emergency room immediately if you have any worsening symptoms including return of the pain, vomiting, fevers, inability to urinate or other worsening symptoms.  I would recommend taking your laxative medication regularly to keep your stools soft for now.

## 2022-08-09 NOTE — ED Notes (Signed)
Patient transported to CT 

## 2023-07-31 ENCOUNTER — Ambulatory Visit: Payer: Medicare PPO | Admitting: Podiatry

## 2023-08-01 ENCOUNTER — Ambulatory Visit: Payer: Medicare PPO | Admitting: Podiatry
